# Patient Record
Sex: Male | Born: 1955 | Race: White | Hispanic: No | Smoking: Never smoker
Health system: Southern US, Community
[De-identification: ages and names within clinical notes are randomized; demographics above are authoritative.]

## PROBLEM LIST (undated history)

## (undated) DIAGNOSIS — K4020 Bilateral inguinal hernia, without obstruction or gangrene, not specified as recurrent: Secondary | ICD-10-CM

## (undated) HISTORY — PX: NECK SURGERY: SHX720

## (undated) HISTORY — PX: EYE SURGERY: SHX253

---
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
## 1997-11-06 ENCOUNTER — Ambulatory Visit (HOSPITAL_COMMUNITY): Admission: RE | Admit: 1997-11-06 | Discharge: 1997-11-06 | Payer: Self-pay | Admitting: Neurosurgery

## 1997-11-15 ENCOUNTER — Encounter: Admission: RE | Admit: 1997-11-15 | Discharge: 1998-02-13 | Payer: Self-pay | Admitting: Internal Medicine

## 1999-01-14 ENCOUNTER — Observation Stay (HOSPITAL_COMMUNITY): Admission: RE | Admit: 1999-01-14 | Discharge: 1999-01-15 | Payer: Self-pay | Admitting: Neurosurgery

## 2004-07-21 HISTORY — PX: COLONOSCOPY: SHX174

## 2004-11-12 ENCOUNTER — Ambulatory Visit: Payer: Self-pay | Admitting: Gastroenterology

## 2004-11-22 ENCOUNTER — Ambulatory Visit: Payer: Self-pay | Admitting: Gastroenterology

## 2004-11-29 ENCOUNTER — Ambulatory Visit: Payer: Self-pay

## 2012-11-23 ENCOUNTER — Encounter: Payer: Self-pay | Admitting: Gastroenterology

## 2014-09-23 ENCOUNTER — Encounter: Payer: Self-pay | Admitting: Gastroenterology

## 2014-12-11 ENCOUNTER — Encounter: Payer: Self-pay | Admitting: Gastroenterology

## 2015-01-18 ENCOUNTER — Ambulatory Visit (AMBULATORY_SURGERY_CENTER): Payer: Self-pay

## 2015-01-18 VITALS — Ht 67.0 in | Wt 164.0 lb

## 2015-01-18 DIAGNOSIS — Z1211 Encounter for screening for malignant neoplasm of colon: Secondary | ICD-10-CM

## 2015-01-18 MED ORDER — NA SULFATE-K SULFATE-MG SULF 17.5-3.13-1.6 GM/177ML PO SOLN: 1.0000 | Freq: Once | ORAL | Status: DC

## 2015-01-18 NOTE — Progress Notes (Signed)
Not on home 02 No anesthesia complications No diet drugs No egg or soy allergies

## 2015-01-23 ENCOUNTER — Encounter: Payer: Self-pay | Admitting: Gastroenterology

## 2015-02-09 ENCOUNTER — Ambulatory Visit (AMBULATORY_SURGERY_CENTER): Payer: 59 | Admitting: Gastroenterology

## 2015-02-09 ENCOUNTER — Encounter: Payer: Self-pay | Admitting: Gastroenterology

## 2015-02-09 VITALS — BP 110/72 | HR 54 | Temp 97.8°F | Resp 22 | Ht 67.0 in | Wt 164.0 lb

## 2015-02-09 DIAGNOSIS — Z1211 Encounter for screening for malignant neoplasm of colon: Secondary | ICD-10-CM

## 2015-02-09 MED ORDER — SODIUM CHLORIDE 0.9 % IV SOLN: 500.0000 mL | INTRAVENOUS | Status: DC

## 2015-02-09 NOTE — Patient Instructions (Signed)
Discharge instructions given. Handouts on diverticulosis and hemorrhoids. Resume previous medications. YOU HAD AN ENDOSCOPIC PROCEDURE TODAY AT THE  ENDOSCOPY CENTER:   Refer to the procedure report that was given to you for any specific questions about what was found during the examination.  If the procedure report does not answer your questions, please call your gastroenterologist to clarify.  If you requested that your care partner not be given the details of your procedure findings, then the procedure report has been included in a sealed envelope for you to review at your convenience later.  YOU SHOULD EXPECT: Some feelings of bloating in the abdomen. Passage of more gas than usual.  Walking can help get rid of the air that was put into your GI tract during the procedure and reduce the bloating. If you had a lower endoscopy (such as a colonoscopy or flexible sigmoidoscopy) you may notice spotting of blood in your stool or on the toilet paper. If you underwent a bowel prep for your procedure, you may not have a normal bowel movement for a few days.  Please Note:  You might notice some irritation and congestion in your nose or some drainage.  This is from the oxygen used during your procedure.  There is no need for concern and it should clear up in a day or so.  SYMPTOMS TO REPORT IMMEDIATELY:   Following lower endoscopy (colonoscopy or flexible sigmoidoscopy):  Excessive amounts of blood in the stool  Significant tenderness or worsening of abdominal pains  Swelling of the abdomen that is new, acute  Fever of 100F or higher   For urgent or emergent issues, a gastroenterologist can be reached at any hour by calling [phone].   DIET: Your first meal following the procedure should be a small meal and then it is ok to progress to your normal diet. Heavy or fried foods are harder to digest and may make you feel nauseous or bloated.  Likewise, meals heavy in dairy and vegetables can  increase bloating.  Drink plenty of fluids but you should avoid alcoholic beverages for 24 hours.  ACTIVITY:  You should plan to take it easy for the rest of today and you should NOT DRIVE or use heavy machinery until tomorrow (because of the sedation medicines used during the test).    FOLLOW UP: Our staff will call the number listed on your records the next business day following your procedure to check on you and address any questions or concerns that you may have regarding the information given to you following your procedure. If we do not reach you, we will leave a message.  However, if you are feeling well and you are not experiencing any problems, there is no need to return our call.  We will assume that you have returned to your regular daily activities without incident.  If any biopsies were taken you will be contacted by phone or by letter within the next 1-3 weeks.  Please call us at [phone] if you have not heard about the biopsies in 3 weeks.    SIGNATURES/CONFIDENTIALITY: You and/or your care partner have signed paperwork which will be entered into your electronic medical record.  These signatures attest to the fact that that the information above on your After Visit Summary has been reviewed and is understood.  Full responsibility of the confidentiality of this discharge information lies with you and/or your care-partner. 

## 2015-02-09 NOTE — Op Note (Signed)
[place]  [address]. [place], [address]   COLONOSCOPY PROCEDURE REPORT  PATIENT: Luke Hoffman, Luke Hoffman  MR#: [id number] BIRTHDATE: [birth year] , 59  yrs. old GENDER: male ENDOSCOPIST: Ladene Artist, MD, [facility] REFERRED [place]:PXTG Hall, M.D. PROCEDURE DATE:  02/09/2015 PROCEDURE:   Colonoscopy, screening First Screening Colonoscopy - Avg.  risk and is 50 yrs.  old or older - No.  Prior Negative Screening - Now for repeat screening. 10 or more years since last screening  History of Adenoma - Now for follow-up colonoscopy & has been > or = to 3 yrs.  N/A  Polyps removed today? No Recommend repeat exam, <10 yrs? No ASA CLASS:   Class II INDICATIONS:Screening for colonic neoplasia and Colorectal Neoplasm Risk Assessment for this procedure is average risk. MEDICATIONS: Monitored anesthesia care and Propofol 200 mg IV DESCRIPTION OF PROCEDURE:   After the risks benefits and alternatives of the procedure were thoroughly explained, informed consent was obtained.  The digital rectal exam revealed no abnormalities of the rectum.   The LB [id number]  endoscope was introduced through the anus and advanced to the cecum, which was identified by both the appendix and ileocecal valve. No adverse events experienced.   The quality of the prep was excellent. (Suprep was used)  The instrument was then slowly withdrawn as the colon was fully examined. Estimated blood loss is zero unless otherwise noted in this procedure report.    COLON FINDINGS: There was mild diverticulosis noted in the sigmoid colon.   The examination was otherwise normal.  Retroflexed views revealed internal Grade I hemorrhoids. The time to cecum = 1.6 Withdrawal time = 8.1   The scope was withdrawn and the procedure completed. COMPLICATIONS: There were no immediate complications.  ENDOSCOPIC IMPRESSION: 1.   Mild diverticulosis in the sigmoid colon 2.   Grade l internal  hemorrhoids  RECOMMENDATIONS: 1.  High fiber diet with liberal fluid intake. 2.  Continue current colorectal screening recommendations for "routine risk" patients with a repeat colonoscopy in 10 years.  eSigned:  Ladene Artist, MD, [facility] 02/09/2015 2:13 PM

## 2015-02-09 NOTE — Progress Notes (Signed)
Report to PACU, RN, vss, BBS= Clear.  

## 2015-02-12 ENCOUNTER — Telehealth: Payer: Self-pay

## 2015-02-12 NOTE — Telephone Encounter (Signed)
  Follow up Call-  Call back number 02/09/2015  Post procedure Call Back phone  # [phone]  Permission to leave phone message Yes     Patient questions:  Do you have a fever, pain , or abdominal swelling? No. Pain Score  0 *  Have you tolerated food without any problems? Yes.    Have you been able to return to your normal activities? Yes.    Do you have any questions about your discharge instructions: Diet   No. Medications  No. Follow up visit  No.  Do you have questions or concerns about your Care? No.  Actions: * If pain score is 4 or above: No action needed, pain <4.

## 2015-03-07 ENCOUNTER — Institutional Professional Consult (permissible substitution): Payer: Self-pay | Admitting: Internal Medicine

## 2015-06-25 ENCOUNTER — Institutional Professional Consult (permissible substitution): Payer: Self-pay | Admitting: Internal Medicine

## 2015-10-08 ENCOUNTER — Institutional Professional Consult (permissible substitution): Payer: Self-pay | Admitting: Internal Medicine

## 2015-11-01 ENCOUNTER — Ambulatory Visit (INDEPENDENT_AMBULATORY_CARE_PROVIDER_SITE_OTHER): Payer: Self-pay | Admitting: Otolaryngology

## 2017-08-03 ENCOUNTER — Ambulatory Visit (INDEPENDENT_AMBULATORY_CARE_PROVIDER_SITE_OTHER): Payer: Self-pay | Admitting: Otolaryngology

## 2017-08-03 DIAGNOSIS — H903 Sensorineural hearing loss, bilateral: Secondary | ICD-10-CM

## 2020-02-27 DIAGNOSIS — I83813 Varicose veins of bilateral lower extremities with pain: Secondary | ICD-10-CM | POA: Diagnosis not present

## 2020-02-27 DIAGNOSIS — R030 Elevated blood-pressure reading, without diagnosis of hypertension: Secondary | ICD-10-CM | POA: Diagnosis not present

## 2020-02-27 DIAGNOSIS — N390 Urinary tract infection, site not specified: Secondary | ICD-10-CM | POA: Diagnosis not present

## 2020-02-27 DIAGNOSIS — E782 Mixed hyperlipidemia: Secondary | ICD-10-CM | POA: Diagnosis not present

## 2020-02-27 DIAGNOSIS — R319 Hematuria, unspecified: Secondary | ICD-10-CM | POA: Diagnosis not present

## 2020-04-09 ENCOUNTER — Other Ambulatory Visit (HOSPITAL_BASED_OUTPATIENT_CLINIC_OR_DEPARTMENT_OTHER): Payer: Self-pay

## 2020-04-09 DIAGNOSIS — R0681 Apnea, not elsewhere classified: Secondary | ICD-10-CM

## 2020-04-10 ENCOUNTER — Other Ambulatory Visit: Payer: Self-pay

## 2020-04-10 ENCOUNTER — Ambulatory Visit: Payer: Medicare Other | Attending: Internal Medicine | Admitting: Neurology

## 2020-04-10 DIAGNOSIS — G4761 Periodic limb movement disorder: Secondary | ICD-10-CM | POA: Diagnosis not present

## 2020-04-10 DIAGNOSIS — R0681 Apnea, not elsewhere classified: Secondary | ICD-10-CM

## 2020-04-10 DIAGNOSIS — G4733 Obstructive sleep apnea (adult) (pediatric): Secondary | ICD-10-CM | POA: Insufficient documentation

## 2020-04-16 NOTE — Procedures (Signed)
   Humphrey A. Merlene Laughter, MD     [email or web address]             NOCTURNAL POLYSOMNOGRAPHY   LOCATION: [facility]  Patient Name: Luke Hoffman, Luke Hoffman Date: 04/10/2020 Gender: Male D.O.B: 08/02/1955 Age (years): 83 Referring Provider: Delphina Cahill Height (inches): 66 Interpreting Physician: Phillips Odor MD, ABSM Weight (lbs): 130 RPSGT: Rosebud Poles BMI: 21 MRN: [id number] Neck Size: 16.00 CLINICAL INFORMATION Sleep Study Type: NPSG     Indication for sleep study: N/A     Epworth Sleepiness Score: 6     SLEEP STUDY TECHNIQUE As per the AASM Manual for the Scoring of Sleep and Associated Events v2.3 (April 2016) with a hypopnea requiring 4% desaturations.  The channels recorded and monitored were frontal, central and occipital EEG, electrooculogram (EOG), submentalis EMG (chin), nasal and oral airflow, thoracic and abdominal wall motion, anterior tibialis EMG, snore microphone, electrocardiogram, and pulse oximetry.  MEDICATIONS Medications self-administered by patient taken the night of the study : N/A No current outpatient medications on file.     SLEEP ARCHITECTURE The study was initiated at 10:17:19 PM and ended at 5:45:48 AM.  Sleep onset time was 5.7 minutes and the sleep efficiency was 86.8%%. The total sleep time was 389.5 minutes.  Stage REM latency was 205.0 minutes.  The patient spent 3.9%% of the night in stage N1 sleep, 70.9%% in stage N2 sleep, 8.6%% in stage N3 and 16.7% in REM.  Alpha intrusion was absent.  Supine sleep was 75.22%.  RESPIRATORY PARAMETERS The overall apnea/hypopnea index (AHI) was 13.4 per hour. There were 27 total apneas, including 25 obstructive, 2 central and 0 mixed apneas. There were 60 hypopneas and 79 RERAs.  The AHI during Stage REM sleep was 22.2 per hour.  AHI while supine was 17.6 per hour.  The mean oxygen saturation was 93.0%. The minimum SpO2 during sleep was 84.0%.  loud snoring  was noted during this study.  CARDIAC DATA The 2 lead EKG demonstrated sinus rhythm. The mean heart rate was 54.8 beats per minute. Other EKG findings include: None.  LEG MOVEMENT DATA Moderate periodic limb movements are noted.    IMPRESSIONS 1. 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 AutoPAP 8-14 is recommended. 2. 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  Delano Metz, MD Diplomate, American Board of Sleep Medicine.  ELECTRONICALLY SIGNED ON:  04/16/2020, 6:01 PM [place] PH: [phone]   FX: [phone] [place]

## 2020-04-20 DIAGNOSIS — G4730 Sleep apnea, unspecified: Secondary | ICD-10-CM

## 2020-04-20 HISTORY — DX: Sleep apnea, unspecified: G47.30

## 2020-05-08 DIAGNOSIS — G4733 Obstructive sleep apnea (adult) (pediatric): Secondary | ICD-10-CM | POA: Diagnosis not present

## 2020-05-16 ENCOUNTER — Other Ambulatory Visit: Payer: Self-pay | Admitting: Surgery

## 2020-05-16 DIAGNOSIS — K4020 Bilateral inguinal hernia, without obstruction or gangrene, not specified as recurrent: Secondary | ICD-10-CM | POA: Diagnosis not present

## 2020-05-18 DIAGNOSIS — Z23 Encounter for immunization: Secondary | ICD-10-CM | POA: Diagnosis not present

## 2020-05-24 DIAGNOSIS — G4733 Obstructive sleep apnea (adult) (pediatric): Secondary | ICD-10-CM | POA: Diagnosis not present

## 2020-05-31 DIAGNOSIS — L578 Other skin changes due to chronic exposure to nonionizing radiation: Secondary | ICD-10-CM | POA: Diagnosis not present

## 2020-05-31 DIAGNOSIS — D225 Melanocytic nevi of trunk: Secondary | ICD-10-CM | POA: Diagnosis not present

## 2020-05-31 DIAGNOSIS — L570 Actinic keratosis: Secondary | ICD-10-CM | POA: Diagnosis not present

## 2020-05-31 DIAGNOSIS — L814 Other melanin hyperpigmentation: Secondary | ICD-10-CM | POA: Diagnosis not present

## 2020-05-31 DIAGNOSIS — G4733 Obstructive sleep apnea (adult) (pediatric): Secondary | ICD-10-CM | POA: Diagnosis not present

## 2020-05-31 DIAGNOSIS — Z86018 Personal history of other benign neoplasm: Secondary | ICD-10-CM | POA: Diagnosis not present

## 2020-05-31 DIAGNOSIS — L821 Other seborrheic keratosis: Secondary | ICD-10-CM | POA: Diagnosis not present

## 2020-06-04 ENCOUNTER — Other Ambulatory Visit: Payer: Self-pay

## 2020-06-04 ENCOUNTER — Encounter (HOSPITAL_BASED_OUTPATIENT_CLINIC_OR_DEPARTMENT_OTHER): Payer: Self-pay | Admitting: Surgery

## 2020-06-08 ENCOUNTER — Other Ambulatory Visit (HOSPITAL_COMMUNITY)
Admission: RE | Admit: 2020-06-08 | Discharge: 2020-06-08 | Disposition: A | Discharge status: Home / Self Care | Payer: Medicare Other | Source: Ambulatory Visit | Attending: Surgery | Admitting: Surgery

## 2020-06-08 DIAGNOSIS — Z01812 Encounter for preprocedural laboratory examination: Secondary | ICD-10-CM | POA: Insufficient documentation

## 2020-06-08 DIAGNOSIS — Z20822 Contact with and (suspected) exposure to covid-19: Secondary | ICD-10-CM | POA: Diagnosis not present

## 2020-06-08 DIAGNOSIS — G4733 Obstructive sleep apnea (adult) (pediatric): Secondary | ICD-10-CM | POA: Diagnosis not present

## 2020-06-08 LAB — SARS CORONAVIRUS 2 (TAT 6-24 HRS): SARS Coronavirus 2: NEGATIVE

## 2020-06-08 NOTE — Progress Notes (Signed)
      Enhanced Recovery after Surgery for Orthopedics Enhanced Recovery after Surgery is a protocol used to improve the stress on your body and your recovery after surgery.  Patient Instructions  . The night before surgery:  o No food after midnight. ONLY clear liquids after midnight  . The day of surgery (if you do NOT have diabetes):  o Drink ONE (1) Pre-Surgery Clear Ensure as directed.   o This drink was given to you during your hospital  pre-op appointment visit. o The pre-op nurse will instruct you on the time to drink the  Pre-Surgery Ensure depending on your surgery time. o Finish the drink at the designated time by the pre-op nurse.  o Nothing else to drink after completing the  Pre-Surgery Clear Ensure.  . The day of surgery (if you have diabetes): o Drink ONE (1) Gatorade 2 (G2) as directed. o This drink was given to you during your hospital  pre-op appointment visit.  o The pre-op nurse will instruct you on the time to drink the   Gatorade 2 (G2) depending on your surgery time. o Color of the Gatorade may vary. Red is not allowed. o Nothing else to drink after completing the  Gatorade 2 (G2).         If you have questions, please contact your surgeon's office.  Surgical soap given. Instructions given. Patient verbalized understanding.

## 2020-06-11 NOTE — H&P (Signed)
Luke Hoffman  Location: [facility] Patient #: [id number] DOB: [birth year] Divorced / Language: English / Race: White Male  History of Present Illness   The patient is a 64 year old male who presents with a complaint of lleft inguinal hernia.  The PCP is Dr. Merlyn Albert  The patient was referred by Dr. Merlyn Albert  The patient had noticed the hernia on the left side for at least 6 years. He's unsure whether it changed much in size. He had 2 events in July 2021 where he had abdominal pain and had to push the hernia back in. He's had no more events like that since then. He's now come for evaluation of the left sided hernia. He's had no prior abdominal surgery. He has not stomach, liver, colon disease. He's had at least 2 negative colonoscopies.  I discussed the indications and complications of hernia surgery with the patient. I discussed both the laparoscopic and open approach to hernia repair.. The potential risks of hernia surgery include, but are not limited to, bleeding, infection, open surgery, nerve injury, and recurrence of the hernia. I provided the patient literature about hernia surgery. We talked about the use of mesh in hernias and their risks. The risk of mesh include chronic infection, erosion to other structures, and chronic pain. He, in fact, has bilateral inguinal hernias. His left inguinal hernia is fairly large, his right inguinal hernia smaller. I think he is a good candidate for laparoscopic inguinal hernia repair. I discussed with him that I am retiring at the end of the year and I offered a partner to do the surgery. We will try to get him scheduled in November for the surgery.  Plan: 1. Laparoscopic bilateral inguinal hernia repair  Review of Systems as stated in this history (HPI) or in the review of systems. Otherwise all other 12 point ROS are negative  Past Medical History: 1. Colonoscopy neg in the past  Social  History: Divorced. 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  The patient's family history was non contributory.   Past Surgical History Darden Palmer, [place]; 04/19/2020 9:39 AM) Colon Polyp Removal - Colonoscopy  Oral Surgery   Diagnostic Studies History Darden Palmer, [place]; 04/19/2020 9:39 AM) Colonoscopy  5-10 years ago  Allergies (Chanel Teressa Senter, CMA; 05/16/2020 9:27 AM) No Known Drug Allergies  [04/19/2020]: Allergies Reconciled   Medication History (Chanel Teressa Senter, [place]; 05/16/2020 9:27 AM) No Current Medications Medications Reconciled  Social History Darden Palmer, [place]; 04/19/2020 9:39 AM) Alcohol use  Occasional alcohol use. Caffeine use  Carbonated beverages, Coffee, Tea. No drug use  Tobacco use  Never smoker.  Family History Darden Palmer, [place]; 04/19/2020 9:39 AM) Arthritis  Mother. Cancer  Father. Heart Disease  Mother. Hypertension  Father, Mother. Respiratory Condition  Father.  Other Problems Darden Palmer, [place]; 04/19/2020 9:39 AM) Inguinal Hernia  Sleep Apnea     Review of Systems Darden Palmer RMA; 04/19/2020 9:39 AM) General Not Present- Appetite Loss, Chills, Fatigue, Fever, Night Sweats, Weight Gain and Weight Loss. Skin Not Present- Change in Wart/Mole, Dryness, Hives, Jaundice, New Lesions, Non-Healing Wounds, Rash and Ulcer. HEENT Present- Wears glasses/contact lenses. Not Present- Earache, Hearing Loss, Hoarseness, Nose Bleed, Oral Ulcers, Ringing in the Ears, Seasonal Allergies, Sinus Pain, Sore Throat, Visual Disturbances and Yellow Eyes. Respiratory Present- Snoring. Not Present- Bloody sputum, Chronic Cough, Difficulty Breathing and Wheezing. Breast Not Present- Breast Mass, Breast Pain, Nipple Discharge and Skin  Changes. Cardiovascular Not Present- Chest Pain, Difficulty Breathing Lying Down, Leg Cramps, Palpitations, Rapid Heart Rate, Shortness of Breath and Swelling of  Extremities. Gastrointestinal Not Present- Abdominal Pain, Bloating, Bloody Stool, Change in Bowel Habits, Chronic diarrhea, Constipation, Difficulty Swallowing, Excessive gas, Gets full quickly at meals, Hemorrhoids, Indigestion, Nausea, Rectal Pain and Vomiting. Male Genitourinary Not Present- Blood in Urine, Change in Urinary Stream, Frequency, Impotence, Nocturia, Painful Urination, Urgency and Urine Leakage. Musculoskeletal Present- Joint Stiffness. Not Present- Back Pain, Joint Pain, Muscle Pain, Muscle Weakness and Swelling of Extremities. Neurological Not Present- Decreased Memory, Fainting, Headaches, Numbness, Seizures, Tingling, Tremor, Trouble walking and Weakness. Psychiatric Not Present- Anxiety, Bipolar, Change in Sleep Pattern, Depression, Fearful and Frequent crying. Endocrine Not Present- Cold Intolerance, Excessive Hunger, Hair Changes, Heat Intolerance, Hot flashes and New Diabetes. Hematology Not Present- Blood Thinners, Easy Bruising, Excessive bleeding, Gland problems, HIV and Persistent Infections.  Vitals (Chanel Nolan CMA; 05/16/2020 9:28 AM) 05/16/2020 9:27 AM Weight: 171.5 lb Height: 67in Body Surface Area: 1.89 m Body Mass Index: 26.86 kg/m  Temp.: 97.53F  Pulse: 72 (Regular)  BP: 122/82(Sitting, Left Arm, Standard)    04/19/2020 9:40 AM Weight: 170.38 lb Temp.: 98.52F  Pulse: 70 (Regular)  P.OX: 98% (Room air) BP: 120/74(Sitting, Left Arm, Standard)   Physical Exam   General: WN WM who is alert and generally healthy appearing. He is wearing a mask. HEENT: Normal. Pupils equal.  Neck: Supple. No mass. No thyroid mass. Lymph Nodes: No supraclavicular or cervical nodes.  Lungs: Clear to auscultation and symmetric breath sounds. Heart: RRR. No murmur or rub.  Abdomen: Soft. No mass. No tenderness. Normal bowel sounds. No abdominal scars.  He has a medium to large left inguinal hernia. He also has a smaller right inguinal hernia. Both  are reducible when he lays down. Rectal: Not done.  Extremities: Good strength and ROM in upper and lower extremities.  Neurologic: Grossly intact to motor and sensory function. Psychiatric: Has normal mood and affect. Behavior is normal.    Assessment & Plan  1.  INGUINAL HERNIA, BILATERAL (K40.20)  Plan:  1. Laparoscopic bilateral inguinal hernia repair   Alphonsa Overall, MD, [facility] Office phone:  [phone]

## 2020-06-12 ENCOUNTER — Other Ambulatory Visit: Payer: Self-pay

## 2020-06-12 ENCOUNTER — Ambulatory Visit (HOSPITAL_BASED_OUTPATIENT_CLINIC_OR_DEPARTMENT_OTHER): Payer: Medicare Other | Admitting: Anesthesiology

## 2020-06-12 ENCOUNTER — Encounter (HOSPITAL_BASED_OUTPATIENT_CLINIC_OR_DEPARTMENT_OTHER): Payer: Self-pay | Admitting: Surgery

## 2020-06-12 ENCOUNTER — Encounter (HOSPITAL_BASED_OUTPATIENT_CLINIC_OR_DEPARTMENT_OTHER)
Admission: RE | Disposition: A | Discharge status: Home / Self Care | Payer: Self-pay | Source: Home / Self Care | Attending: Surgery

## 2020-06-12 ENCOUNTER — Ambulatory Visit (HOSPITAL_BASED_OUTPATIENT_CLINIC_OR_DEPARTMENT_OTHER)
Admission: RE | Admit: 2020-06-12 | Discharge: 2020-06-12 | Disposition: A | Discharge status: Home / Self Care | Payer: Medicare Other | Attending: Surgery | Admitting: Surgery

## 2020-06-12 DIAGNOSIS — K4020 Bilateral inguinal hernia, without obstruction or gangrene, not specified as recurrent: Secondary | ICD-10-CM | POA: Insufficient documentation

## 2020-06-12 DIAGNOSIS — Z86010 Personal history of colonic polyps: Secondary | ICD-10-CM | POA: Diagnosis not present

## 2020-06-12 DIAGNOSIS — G4730 Sleep apnea, unspecified: Secondary | ICD-10-CM | POA: Diagnosis not present

## 2020-06-12 HISTORY — PX: INSERTION OF MESH: SHX5868

## 2020-06-12 HISTORY — DX: Bilateral inguinal hernia, without obstruction or gangrene, not specified as recurrent: K40.20

## 2020-06-12 HISTORY — PX: INGUINAL HERNIA REPAIR: SHX194

## 2020-06-12 SURGERY — REPAIR, HERNIA, INGUINAL, LAPAROSCOPIC
Anesthesia: General | Site: Abdomen | Laterality: Bilateral

## 2020-06-12 MED ORDER — BUPIVACAINE HCL 0.25 % IJ SOLN
INTRAMUSCULAR | Status: DC | PRN
  Administered 2020-06-12: 25 mL

## 2020-06-12 MED ORDER — LIDOCAINE 2% (20 MG/ML) 5 ML SYRINGE
INTRAMUSCULAR | Status: AC
  Filled 2020-06-12: qty 5

## 2020-06-12 MED ORDER — HYDROCODONE-ACETAMINOPHEN 5-325 MG PO TABS: 1.0000 | ORAL_TABLET | Freq: Four times a day (QID) | ORAL | 0 refills | Status: DC | PRN

## 2020-06-12 MED ORDER — LACTATED RINGERS IV SOLN: INTRAVENOUS | Status: DC

## 2020-06-12 MED ORDER — EPHEDRINE 5 MG/ML INJ
INTRAVENOUS | Status: AC
  Filled 2020-06-12: qty 10

## 2020-06-12 MED ORDER — CEFAZOLIN SODIUM-DEXTROSE 2-4 GM/100ML-% IV SOLN
2.0000 g | INTRAVENOUS | Status: AC
  Administered 2020-06-12: 2 g via INTRAVENOUS

## 2020-06-12 MED ORDER — ONDANSETRON HCL 4 MG/2ML IJ SOLN
INTRAMUSCULAR | Status: DC | PRN
  Administered 2020-06-12: 4 mg via INTRAVENOUS

## 2020-06-12 MED ORDER — ROCURONIUM BROMIDE 10 MG/ML (PF) SYRINGE
PREFILLED_SYRINGE | INTRAVENOUS | Status: AC
  Filled 2020-06-12: qty 10

## 2020-06-12 MED ORDER — HYDROMORPHONE HCL 1 MG/ML IJ SOLN: 0.2500 mg | INTRAMUSCULAR | Status: DC | PRN

## 2020-06-12 MED ORDER — CEFAZOLIN SODIUM-DEXTROSE 2-4 GM/100ML-% IV SOLN
INTRAVENOUS | Status: AC
  Filled 2020-06-12: qty 100

## 2020-06-12 MED ORDER — PHENYLEPHRINE 40 MCG/ML (10ML) SYRINGE FOR IV PUSH (FOR BLOOD PRESSURE SUPPORT)
PREFILLED_SYRINGE | INTRAVENOUS | Status: AC
  Filled 2020-06-12: qty 10

## 2020-06-12 MED ORDER — MEPERIDINE HCL 25 MG/ML IJ SOLN: 6.2500 mg | INTRAMUSCULAR | Status: DC | PRN

## 2020-06-12 MED ORDER — MIDAZOLAM HCL 5 MG/5ML IJ SOLN
INTRAMUSCULAR | Status: DC | PRN
  Administered 2020-06-12: 2 mg via INTRAVENOUS

## 2020-06-12 MED ORDER — ATROPINE SULFATE 0.4 MG/ML IJ SOLN
INTRAMUSCULAR | Status: DC | PRN
  Administered 2020-06-12: .4 mg via INTRAVENOUS

## 2020-06-12 MED ORDER — CHLORHEXIDINE GLUCONATE 4 % EX LIQD: 60.0000 mL | Freq: Once | CUTANEOUS | Status: DC

## 2020-06-12 MED ORDER — PROMETHAZINE HCL 25 MG/ML IJ SOLN: 6.2500 mg | INTRAMUSCULAR | Status: DC | PRN

## 2020-06-12 MED ORDER — ROCURONIUM BROMIDE 100 MG/10ML IV SOLN
INTRAVENOUS | Status: DC | PRN
  Administered 2020-06-12: 80 mg via INTRAVENOUS

## 2020-06-12 MED ORDER — PROPOFOL 10 MG/ML IV BOLUS
INTRAVENOUS | Status: DC | PRN
  Administered 2020-06-12: 200 mg via INTRAVENOUS

## 2020-06-12 MED ORDER — LIDOCAINE HCL (CARDIAC) PF 100 MG/5ML IV SOSY
PREFILLED_SYRINGE | INTRAVENOUS | Status: DC | PRN
  Administered 2020-06-12: 80 mg via INTRAVENOUS

## 2020-06-12 MED ORDER — KETOROLAC TROMETHAMINE 30 MG/ML IJ SOLN
INTRAMUSCULAR | Status: AC
  Filled 2020-06-12: qty 1

## 2020-06-12 MED ORDER — SUGAMMADEX SODIUM 200 MG/2ML IV SOLN
INTRAVENOUS | Status: DC | PRN
  Administered 2020-06-12: 200 mg via INTRAVENOUS

## 2020-06-12 MED ORDER — ACETAMINOPHEN 500 MG PO TABS
ORAL_TABLET | ORAL | Status: AC
  Filled 2020-06-12: qty 2

## 2020-06-12 MED ORDER — EPHEDRINE SULFATE 50 MG/ML IJ SOLN
INTRAMUSCULAR | Status: DC | PRN
  Administered 2020-06-12: 25 mg via INTRAVENOUS

## 2020-06-12 MED ORDER — FENTANYL CITRATE (PF) 100 MCG/2ML IJ SOLN
INTRAMUSCULAR | Status: AC
  Filled 2020-06-12: qty 2

## 2020-06-12 MED ORDER — KETOROLAC TROMETHAMINE 30 MG/ML IJ SOLN
INTRAMUSCULAR | Status: DC | PRN
  Administered 2020-06-12: 30 mg via INTRAVENOUS

## 2020-06-12 MED ORDER — MIDAZOLAM HCL 2 MG/2ML IJ SOLN
INTRAMUSCULAR | Status: AC
  Filled 2020-06-12: qty 2

## 2020-06-12 MED ORDER — AMISULPRIDE (ANTIEMETIC) 5 MG/2ML IV SOLN: 10.0000 mg | Freq: Once | INTRAVENOUS | Status: DC | PRN

## 2020-06-12 MED ORDER — OXYCODONE HCL 5 MG/5ML PO SOLN: 5.0000 mg | Freq: Once | ORAL | Status: DC | PRN

## 2020-06-12 MED ORDER — ACETAMINOPHEN 500 MG PO TABS
1000.0000 mg | ORAL_TABLET | ORAL | Status: AC
  Administered 2020-06-12: 1000 mg via ORAL

## 2020-06-12 MED ORDER — FENTANYL CITRATE (PF) 100 MCG/2ML IJ SOLN
INTRAMUSCULAR | Status: DC | PRN
  Administered 2020-06-12 (×3): 50 ug via INTRAVENOUS

## 2020-06-12 MED ORDER — OXYCODONE HCL 5 MG PO TABS: 5.0000 mg | ORAL_TABLET | Freq: Once | ORAL | Status: DC | PRN

## 2020-06-12 MED ORDER — PROPOFOL 10 MG/ML IV BOLUS
INTRAVENOUS | Status: AC
  Filled 2020-06-12: qty 20

## 2020-06-12 MED ORDER — DEXAMETHASONE SODIUM PHOSPHATE 4 MG/ML IJ SOLN
INTRAMUSCULAR | Status: DC | PRN
  Administered 2020-06-12: 5 mg via INTRAVENOUS

## 2020-06-12 MED ORDER — ONDANSETRON HCL 4 MG/2ML IJ SOLN
INTRAMUSCULAR | Status: AC
  Filled 2020-06-12: qty 2

## 2020-06-12 SURGICAL SUPPLY — 38 items
ADH SKN CLS APL DERMABOND .7 (GAUZE/BANDAGES/DRESSINGS) ×1
BLADE CLIPPER SURG (BLADE) ×2 IMPLANT
COVER WAND RF STERILE (DRAPES) ×3 IMPLANT
DERMABOND ADVANCED (GAUZE/BANDAGES/DRESSINGS) ×2
DERMABOND ADVANCED .7 DNX12 (GAUZE/BANDAGES/DRESSINGS) ×1 IMPLANT
DISSECT BALLN SPACEMKR + OVL (BALLOONS) ×3
DISSECTOR BALLN SPACEMKR + OVL (BALLOONS) ×1 IMPLANT
ELECT REM PT RETURN 9FT ADLT (ELECTROSURGICAL) ×3
ELECTRODE REM PT RTRN 9FT ADLT (ELECTROSURGICAL) ×1 IMPLANT
GLOVE BIO SURGEON STRL SZ 6.5 (GLOVE) ×1 IMPLANT
GLOVE BIO SURGEON STRL SZ7.5 (GLOVE) ×2 IMPLANT
GLOVE BIO SURGEONS STRL SZ 6.5 (GLOVE) ×1
GLOVE BIOGEL PI IND STRL 7.0 (GLOVE) IMPLANT
GLOVE BIOGEL PI IND STRL 7.5 (GLOVE) IMPLANT
GLOVE BIOGEL PI INDICATOR 7.0 (GLOVE) ×2
GLOVE BIOGEL PI INDICATOR 7.5 (GLOVE) ×2
GLOVE SURG SYN 7.5  E (GLOVE) ×3
GLOVE SURG SYN 7.5 E (GLOVE) ×1 IMPLANT
GLOVE SURG SYN 7.5 PF PI (GLOVE) ×1 IMPLANT
GOWN STRL REUS W/ TWL LRG LVL3 (GOWN DISPOSABLE) ×2 IMPLANT
GOWN STRL REUS W/ TWL XL LVL3 (GOWN DISPOSABLE) ×1 IMPLANT
GOWN STRL REUS W/TWL LRG LVL3 (GOWN DISPOSABLE) ×6
GOWN STRL REUS W/TWL XL LVL3 (GOWN DISPOSABLE) ×3
MESH 3DMAX LIGHT 4.1X6.2 RT LR (Mesh General) ×2 IMPLANT
MESH 3DMAX LIGHT 4.8X6.7 LT XL (Mesh General) ×2 IMPLANT
NS IRRIG 1000ML POUR BTL (IV SOLUTION) ×3 IMPLANT
PACK BASIN DAY SURGERY FS (CUSTOM PROCEDURE TRAY) ×2 IMPLANT
SCISSORS LAP 5X35 DISP (ENDOMECHANICALS) ×2 IMPLANT
SET TUBE SMOKE EVAC HIGH FLOW (TUBING) ×3 IMPLANT
SLEEVE ENDOPATH XCEL 5M (ENDOMECHANICALS) ×3 IMPLANT
SUT MNCRL AB 4-0 PS2 18 (SUTURE) ×3 IMPLANT
SUT VICRYL 3-0 CR8 SH (SUTURE) ×2 IMPLANT
TACKER 5MM HERNIA 3.5CML NAB (ENDOMECHANICALS) ×3 IMPLANT
TOWEL GREEN STERILE FF (TOWEL DISPOSABLE) ×3 IMPLANT
TRAY FOL W/BAG SLVR 16FR STRL (SET/KITS/TRAYS/PACK) IMPLANT
TRAY FOLEY W/BAG SLVR 16FR LF (SET/KITS/TRAYS/PACK) ×3
TRAY LAPAROSCOPIC (CUSTOM PROCEDURE TRAY) ×3 IMPLANT
TROCAR XCEL NON-BLD 5MMX100MML (ENDOMECHANICALS) ×3 IMPLANT

## 2020-06-12 NOTE — Interval H&P Note (Signed)
History and Physical Interval Note:  06/12/2020 9:19 AM  Luke Hoffman  has presented today for surgery, with the diagnosis of bilateral inguinal hernia.  The various methods of treatment have been discussed with the patient and family.   His son, Sharen Heck, is here with him.  After consideration of risks, benefits and other options for treatment, the patient has consented to  Procedure(s): LAPAROSCOPIC BILATERAL INGUINAL HERNIA REPAIR WITH MESH (Bilateral) as a surgical intervention.  The patient's history has been reviewed, patient examined, no change in status, stable for surgery.  I have reviewed the patient's chart and labs.  Questions were answered to the patient's satisfaction.     Shann Medal

## 2020-06-12 NOTE — Anesthesia Postprocedure Evaluation (Signed)
Anesthesia Post Note  Patient: JOVAN COLLIGAN  Procedure(s) Performed: LAPAROSCOPIC BILATERAL INGUINAL HERNIA REPAIR WITH MESH (Bilateral Abdomen) INSERTION OF MESH (Bilateral Abdomen)     Patient location during evaluation: PACU Anesthesia Type: General Level of consciousness: awake and alert Pain management: pain level controlled Vital Signs Assessment: post-procedure vital signs reviewed and stable Respiratory status: spontaneous breathing, nonlabored ventilation and respiratory function stable Cardiovascular status: blood pressure returned to baseline and stable Postop Assessment: no apparent nausea or vomiting Anesthetic complications: no   No complications documented.  Last Vitals:  Vitals:   06/12/20 1145 06/12/20 1200  BP: 140/81 132/88  Pulse: 79 63  Resp: 19 16  Temp:  36.8 C  SpO2: 98% 97%    Last Pain:  Vitals:   06/12/20 1200  TempSrc:   PainSc: 0-No pain                 Lynda Rainwater

## 2020-06-12 NOTE — Anesthesia Procedure Notes (Signed)
Procedure Name: Intubation Date/Time: 06/12/2020 9:39 AM Performed by: Bufford Spikes, CRNA Pre-anesthesia Checklist: Patient identified, Emergency Drugs available, Suction available and Patient being monitored Patient Re-evaluated:Patient Re-evaluated prior to induction Oxygen Delivery Method: Circle system utilized Preoxygenation: Pre-oxygenation with 100% oxygen Induction Type: IV induction Ventilation: Mask ventilation without difficulty Laryngoscope Size: Miller and 2 Grade View: Grade II Tube type: Oral Tube size: 7.0 mm Number of attempts: 2 Airway Equipment and Method: Stylet and Oral airway Placement Confirmation: ETT inserted through vocal cords under direct vision,  positive ETCO2 and breath sounds checked- equal and bilateral Secured at: 21 cm Tube secured with: Tape Dental Injury: Teeth and Oropharynx as per pre-operative assessment

## 2020-06-12 NOTE — Anesthesia Preprocedure Evaluation (Signed)
Anesthesia Evaluation  Patient identified by MRN, date of birth, ID band Patient awake    Reviewed: Allergy & Precautions, NPO status , Patient's Chart, lab work & pertinent test results  Airway Mallampati: II  TM Distance: >3 FB Neck ROM: Full    Dental no notable dental hx.    Pulmonary sleep apnea ,    Pulmonary exam normal breath sounds clear to auscultation       Cardiovascular negative cardio ROS Normal cardiovascular exam Rhythm:Regular Rate:Normal     Neuro/Psych negative neurological ROS  negative psych ROS   GI/Hepatic negative GI ROS, Neg liver ROS,   Endo/Other  negative endocrine ROS  Renal/GU negative Renal ROS  negative genitourinary   Musculoskeletal negative musculoskeletal ROS (+)   Abdominal   Peds negative pediatric ROS (+)  Hematology negative hematology ROS (+)   Anesthesia Other Findings   Reproductive/Obstetrics negative OB ROS                             Anesthesia Physical Anesthesia Plan  ASA: II  Anesthesia Plan: General   Post-op Pain Management:    Induction: Intravenous  PONV Risk Score and Plan: 2 and Ondansetron, Midazolam and Treatment may vary due to age or medical condition  Airway Management Planned: Oral ETT  Additional Equipment:   Intra-op Plan:   Post-operative Plan: Extubation in OR  Informed Consent: I have reviewed the patients History and Physical, chart, labs and discussed the procedure including the risks, benefits and alternatives for the proposed anesthesia with the patient or authorized representative who has indicated his/her understanding and acceptance.     Dental advisory given  Plan Discussed with: CRNA  Anesthesia Plan Comments:         Anesthesia Quick Evaluation

## 2020-06-12 NOTE — Op Note (Signed)
06/12/2020  11:07 AM  PATIENT:  Luke Hoffman, 64 y.o., male  MRN: [id number]  DOB: 1955/08/21  PREOP DIAGNOSIS:  bilateral inguinal hernia  POSTOP DIAGNOSIS:   Bilateral direct inguinal hernias, left larger than right  PROCEDURE:   Procedure(s):  LAPAROSCOPIC BILATERAL INGUINAL HERNIA REPAIR WITH MESH,  INSERTION OF MESH  SURGEON:   Alphonsa Overall, M.D.  ANESTHESIA:   general  Anesthesiologist: Lynda Rainwater, MD CRNA: Bufford Spikes, CRNA; Glory Buff, CRNA  General  EBL:  minimal  ml  LOCAL MEDICATIONS USED:   25 cc of 1/4% marcaine  SPECIMEN:   None  COUNTS CORRECT:  YES  INDICATIONS FOR PROCEDURE:  [name] is a 64 y.o. (DOB: September 02, 1955) white male whose primary care physician is Celene Squibb, MD and comes for laparoscopic repair of bilateral inguinal hernias.   The indications and risks of the hernia surgery were explained to the patient.  The risks include, but are not limited to, infection, bleeding, recurrence of the hernia, and nerve injury.  Operative Note:  The patient was taken to room number 5 at [facility].  He underwent a general anesthesia.     A time out was held and the surgical checklist run.   His lower abdomen was shaved and then prepped with chloroprep.  he had a foley catheter placed.   I made an infraumbilical incision and cut down to the rectus fascia.  I went the left side, opened the anterior rectus fascia, retracted the rectus muscle anteriorly and placed the PBD balloon in the preperitoneal space.  The balloon was insufflated under direct visualization.  The balloon was in the correct position and the dissection created a preperitoneal space.   The patient had a bilateral direct inguinal hernias, the left side medium/large sized and the right side medium sized.  I dissected the preperitoneal fat posteriorly.  The cord structures were encircled.  There was no evidence of an indirect inguinal hernia.  The peritoneum  was reduced to below the level of the anterior iliac spine.   I then placed the mesh in the preperitoneal space.  I used the Extra large Bard 3D Max Light Mesh (12.2 cm x 17.0 cm) on the left and the large Bard 3D Max Light Mesh (10.3 cm x 15.7 cm) on the right.  The mesh lay flat.  I used the Protac to secure the mesh.  I used 10 clips on the left and 10 clips on the right.  The mesh was tacked medially to the pubic bone, inferiorly to Cooper's ligament, superiorly to transversalis fascia, and laterally.  There was an overlap of 2 cm in the midline of the mesh. I avoided tacks in the area lateral to the iliac vessels and laterally below the ileo-inguinal ligament.   Photos were taken and placed in the chart.  The preperitoneal space was deflated, there were no gaps around the mesh, and the trocars removed under direct visualization.   I infiltrated the wounds with 25 cc of 1/4% Marcaine.  The anterior rectus fascia was closed with 0 Vicryl.  The skin was closed with 4-0 monocryl and painted with LiquidBand. The sponge and needle count were correct at the end of the case.  The patient was transported to the recovery room in good condition.  His foley was removed.   The patient will go home today.  Discharge instructions reviewed.  Alphonsa Overall, MD, [facility] Pager: [phone] Office phone:  [phone]

## 2020-06-12 NOTE — Transfer of Care (Signed)
Immediate Anesthesia Transfer of Care Note  Patient: Luke Hoffman  Procedure(s) Performed: LAPAROSCOPIC BILATERAL INGUINAL HERNIA REPAIR WITH MESH (Bilateral Abdomen) INSERTION OF MESH (Bilateral Abdomen)  Patient Location: PACU  Anesthesia Type:General  Level of Consciousness: drowsy and patient cooperative  Airway & Oxygen Therapy: Patient Spontanous Breathing and Patient connected to face mask oxygen  Post-op Assessment: Report given to RN and Post -op Vital signs reviewed and stable  Post vital signs: Reviewed and stable  Last Vitals:  Vitals Value Taken Time  BP 151/91 06/12/20 1114  Temp    Pulse 91 06/12/20 1115  Resp 28 06/12/20 1115  SpO2 100 % 06/12/20 1115  Vitals shown include unvalidated device data.  Last Pain:  Vitals:   06/12/20 0807  TempSrc: Oral  PainSc: 0-No pain      Patients Stated Pain Goal: 3 (05/06/50 0258)  Complications: No complications documented.

## 2020-06-12 NOTE — Discharge Instructions (Signed)
Next dose of tylenol can be given after 2:15PM. Next dose of NSAID (Ibuprofen, Motrin, Aleve) can be given after 5:00PM.  Post Anesthesia Home Care Instructions  Activity: Get plenty of rest for the remainder of the day. A responsible individual must stay with you for 24 hours following the procedure.  For the next 24 hours, DO NOT: -Drive a car -Paediatric nurse -Drink alcoholic beverages -Take any medication unless instructed by your physician -Make any legal decisions or sign important papers.  Meals: Start with liquid foods such as gelatin or soup. Progress to regular foods as tolerated. Avoid greasy, spicy, heavy foods. If nausea and/or vomiting occur, drink only clear liquids until the nausea and/or vomiting subsides. Call your physician if vomiting continues.  Special Instructions/Symptoms: Your throat may feel dry or sore from the anesthesia or the breathing tube placed in your throat during surgery. If this causes discomfort, gargle with warm salt water. The discomfort should disappear within 24 hours.  If you had a scopolamine patch placed behind your ear for the management of post- operative nausea and/or vomiting:  1. The medication in the patch is effective for 72 hours, after which it should be removed.  Wrap patch in a tissue and discard in the trash. Wash hands thoroughly with soap and water. 2. You may remove the patch earlier than 72 hours if you experience unpleasant side effects which may include dry mouth, dizziness or visual disturbances. 3. Avoid touching the patch. Wash your hands with soap and water after contact with the patch.   [facility] - DISCHARGE INSTRUCTIONS TO PATIENT  Activity:  Driving - May drive in 2 to 4 days, if doing well and off pain meds   Lifting - No lifting more than 15 pounds for 4 weeks.                       Practice your Covid-19 protection:  Wear a mask, social distance, and wash your hands frequently  Wound Care:    Leave the incision dry for 2 days, then you may shower  Diet:  As tolerated  Follow up appointment:  Call Dr. Pollie Friar office [facility]) at [phone] for an appointment in 2 weeks..  Medications and dosages:  Resume your home medications.  You have a prescription for:  Vicodin             You may also take Tylenol, ibuprofen, or Aleve for pain  Call Dr. Lucia Gaskins or his office  [phone]) if you have:  Temperature greater than 100.4,  Persistent nausea and vomiting,  Severe uncontrolled pain,  Redness, tenderness, or signs of infection (pain, swelling, redness, odor or green/yellow discharge around the site),  Difficulty breathing, headache or visual disturbances,  Any other questions or concerns you may have after discharge.  In an emergency, call 911 or go to an Emergency Department at a nearby hospital.    Post Anesthesia Home Care Instructions  Activity: Get plenty of rest for the remainder of the day. A responsible individual must stay with you for 24 hours following the procedure.  For the next 24 hours, DO NOT: -Drive a car -Paediatric nurse -Drink alcoholic beverages -Take any medication unless instructed by your physician -Make any legal decisions or sign important papers.  Meals: Start with liquid foods such as gelatin or soup. Progress to regular foods as tolerated. Avoid greasy, spicy, heavy foods. If nausea and/or vomiting occur, drink only clear liquids until the nausea  and/or vomiting subsides. Call your physician if vomiting continues.  Special Instructions/Symptoms: Your throat may feel dry or sore from the anesthesia or the breathing tube placed in your throat during surgery. If this causes discomfort, gargle with warm salt water. The discomfort should disappear within 24 hours.  If you had a scopolamine patch placed behind your ear for the management of post- operative nausea and/or vomiting:  1. The medication in the patch is  effective for 72 hours, after which it should be removed.  Wrap patch in a tissue and discard in the trash. Wash hands thoroughly with soap and water. 2. You may remove the patch earlier than 72 hours if you experience unpleasant side effects which may include dry mouth, dizziness or visual disturbances. 3. Avoid touching the patch. Wash your hands with soap and water after contact with the patch.

## 2020-06-15 ENCOUNTER — Encounter (HOSPITAL_BASED_OUTPATIENT_CLINIC_OR_DEPARTMENT_OTHER): Payer: Self-pay | Admitting: Surgery

## 2020-07-08 DIAGNOSIS — G4733 Obstructive sleep apnea (adult) (pediatric): Secondary | ICD-10-CM | POA: Diagnosis not present

## 2020-07-23 DIAGNOSIS — R030 Elevated blood-pressure reading, without diagnosis of hypertension: Secondary | ICD-10-CM | POA: Diagnosis not present

## 2020-07-23 DIAGNOSIS — G4733 Obstructive sleep apnea (adult) (pediatric): Secondary | ICD-10-CM | POA: Diagnosis not present

## 2020-07-23 DIAGNOSIS — K4020 Bilateral inguinal hernia, without obstruction or gangrene, not specified as recurrent: Secondary | ICD-10-CM | POA: Diagnosis not present

## 2020-08-08 DIAGNOSIS — G4733 Obstructive sleep apnea (adult) (pediatric): Secondary | ICD-10-CM | POA: Diagnosis not present

## 2020-08-10 DIAGNOSIS — H52203 Unspecified astigmatism, bilateral: Secondary | ICD-10-CM | POA: Diagnosis not present

## 2020-08-10 DIAGNOSIS — H5213 Myopia, bilateral: Secondary | ICD-10-CM | POA: Diagnosis not present

## 2020-08-10 DIAGNOSIS — H524 Presbyopia: Secondary | ICD-10-CM | POA: Diagnosis not present

## 2020-08-10 DIAGNOSIS — H25813 Combined forms of age-related cataract, bilateral: Secondary | ICD-10-CM | POA: Diagnosis not present

## 2020-09-03 DIAGNOSIS — R319 Hematuria, unspecified: Secondary | ICD-10-CM | POA: Diagnosis not present

## 2020-09-03 DIAGNOSIS — N029 Recurrent and persistent hematuria with unspecified morphologic changes: Secondary | ICD-10-CM | POA: Diagnosis not present

## 2020-09-03 DIAGNOSIS — E875 Hyperkalemia: Secondary | ICD-10-CM | POA: Diagnosis not present

## 2020-09-03 DIAGNOSIS — H9190 Unspecified hearing loss, unspecified ear: Secondary | ICD-10-CM | POA: Diagnosis not present

## 2020-09-03 DIAGNOSIS — I83813 Varicose veins of bilateral lower extremities with pain: Secondary | ICD-10-CM | POA: Diagnosis not present

## 2020-09-03 DIAGNOSIS — L309 Dermatitis, unspecified: Secondary | ICD-10-CM | POA: Diagnosis not present

## 2020-09-06 DIAGNOSIS — M19041 Primary osteoarthritis, right hand: Secondary | ICD-10-CM | POA: Diagnosis not present

## 2020-09-06 DIAGNOSIS — M19042 Primary osteoarthritis, left hand: Secondary | ICD-10-CM | POA: Diagnosis not present

## 2020-09-06 DIAGNOSIS — Z0001 Encounter for general adult medical examination with abnormal findings: Secondary | ICD-10-CM | POA: Diagnosis not present

## 2020-09-06 DIAGNOSIS — E875 Hyperkalemia: Secondary | ICD-10-CM | POA: Diagnosis not present

## 2020-09-06 DIAGNOSIS — L570 Actinic keratosis: Secondary | ICD-10-CM | POA: Diagnosis not present

## 2020-09-06 DIAGNOSIS — M79676 Pain in unspecified toe(s): Secondary | ICD-10-CM | POA: Diagnosis not present

## 2020-09-06 DIAGNOSIS — K4030 Unilateral inguinal hernia, with obstruction, without gangrene, not specified as recurrent: Secondary | ICD-10-CM | POA: Diagnosis not present

## 2020-09-08 DIAGNOSIS — G4733 Obstructive sleep apnea (adult) (pediatric): Secondary | ICD-10-CM | POA: Diagnosis not present

## 2020-10-01 DIAGNOSIS — L309 Dermatitis, unspecified: Secondary | ICD-10-CM | POA: Diagnosis not present

## 2020-10-01 DIAGNOSIS — L570 Actinic keratosis: Secondary | ICD-10-CM | POA: Diagnosis not present

## 2020-10-06 DIAGNOSIS — G4733 Obstructive sleep apnea (adult) (pediatric): Secondary | ICD-10-CM | POA: Diagnosis not present

## 2020-11-06 DIAGNOSIS — G4733 Obstructive sleep apnea (adult) (pediatric): Secondary | ICD-10-CM | POA: Diagnosis not present

## 2020-12-06 DIAGNOSIS — G4733 Obstructive sleep apnea (adult) (pediatric): Secondary | ICD-10-CM | POA: Diagnosis not present

## 2020-12-11 DIAGNOSIS — Q828 Other specified congenital malformations of skin: Secondary | ICD-10-CM | POA: Diagnosis not present

## 2020-12-11 DIAGNOSIS — L309 Dermatitis, unspecified: Secondary | ICD-10-CM | POA: Diagnosis not present

## 2021-01-06 DIAGNOSIS — G4733 Obstructive sleep apnea (adult) (pediatric): Secondary | ICD-10-CM | POA: Diagnosis not present

## 2021-02-05 DIAGNOSIS — G4733 Obstructive sleep apnea (adult) (pediatric): Secondary | ICD-10-CM | POA: Diagnosis not present

## 2021-03-07 DIAGNOSIS — R978 Other abnormal tumor markers: Secondary | ICD-10-CM | POA: Diagnosis not present

## 2021-03-07 DIAGNOSIS — I83893 Varicose veins of bilateral lower extremities with other complications: Secondary | ICD-10-CM | POA: Diagnosis not present

## 2021-03-07 DIAGNOSIS — E782 Mixed hyperlipidemia: Secondary | ICD-10-CM | POA: Diagnosis not present

## 2021-03-07 DIAGNOSIS — Z0001 Encounter for general adult medical examination with abnormal findings: Secondary | ICD-10-CM | POA: Diagnosis not present

## 2021-03-07 DIAGNOSIS — E875 Hyperkalemia: Secondary | ICD-10-CM | POA: Diagnosis not present

## 2021-03-07 DIAGNOSIS — K4090 Unilateral inguinal hernia, without obstruction or gangrene, not specified as recurrent: Secondary | ICD-10-CM | POA: Diagnosis not present

## 2021-03-07 DIAGNOSIS — R030 Elevated blood-pressure reading, without diagnosis of hypertension: Secondary | ICD-10-CM | POA: Diagnosis not present

## 2021-03-08 DIAGNOSIS — G4733 Obstructive sleep apnea (adult) (pediatric): Secondary | ICD-10-CM | POA: Diagnosis not present

## 2021-03-19 DIAGNOSIS — G4733 Obstructive sleep apnea (adult) (pediatric): Secondary | ICD-10-CM | POA: Diagnosis not present

## 2021-04-08 DIAGNOSIS — G4733 Obstructive sleep apnea (adult) (pediatric): Secondary | ICD-10-CM | POA: Diagnosis not present

## 2021-04-18 DIAGNOSIS — I10 Essential (primary) hypertension: Secondary | ICD-10-CM | POA: Diagnosis not present

## 2021-05-13 DIAGNOSIS — Z23 Encounter for immunization: Secondary | ICD-10-CM | POA: Diagnosis not present

## 2021-06-06 DIAGNOSIS — L578 Other skin changes due to chronic exposure to nonionizing radiation: Secondary | ICD-10-CM | POA: Diagnosis not present

## 2021-06-06 DIAGNOSIS — Z86018 Personal history of other benign neoplasm: Secondary | ICD-10-CM | POA: Diagnosis not present

## 2021-06-06 DIAGNOSIS — E875 Hyperkalemia: Secondary | ICD-10-CM | POA: Diagnosis not present

## 2021-06-06 DIAGNOSIS — L821 Other seborrheic keratosis: Secondary | ICD-10-CM | POA: Diagnosis not present

## 2021-06-06 DIAGNOSIS — L814 Other melanin hyperpigmentation: Secondary | ICD-10-CM | POA: Diagnosis not present

## 2021-06-06 DIAGNOSIS — Z23 Encounter for immunization: Secondary | ICD-10-CM | POA: Diagnosis not present

## 2021-06-06 DIAGNOSIS — R030 Elevated blood-pressure reading, without diagnosis of hypertension: Secondary | ICD-10-CM | POA: Diagnosis not present

## 2021-06-06 DIAGNOSIS — D225 Melanocytic nevi of trunk: Secondary | ICD-10-CM | POA: Diagnosis not present

## 2021-06-06 DIAGNOSIS — L570 Actinic keratosis: Secondary | ICD-10-CM | POA: Diagnosis not present

## 2021-06-06 DIAGNOSIS — E782 Mixed hyperlipidemia: Secondary | ICD-10-CM | POA: Insufficient documentation

## 2021-06-06 DIAGNOSIS — R978 Other abnormal tumor markers: Secondary | ICD-10-CM | POA: Diagnosis not present

## 2021-06-06 DIAGNOSIS — L565 Disseminated superficial actinic porokeratosis (DSAP): Secondary | ICD-10-CM | POA: Diagnosis not present

## 2021-06-12 DIAGNOSIS — I10 Essential (primary) hypertension: Secondary | ICD-10-CM | POA: Insufficient documentation

## 2021-08-12 DIAGNOSIS — H25813 Combined forms of age-related cataract, bilateral: Secondary | ICD-10-CM | POA: Diagnosis not present

## 2021-09-13 DIAGNOSIS — E782 Mixed hyperlipidemia: Secondary | ICD-10-CM | POA: Diagnosis not present

## 2021-09-13 DIAGNOSIS — R978 Other abnormal tumor markers: Secondary | ICD-10-CM | POA: Diagnosis not present

## 2021-09-13 DIAGNOSIS — R030 Elevated blood-pressure reading, without diagnosis of hypertension: Secondary | ICD-10-CM | POA: Diagnosis not present

## 2021-09-18 DIAGNOSIS — Z0001 Encounter for general adult medical examination with abnormal findings: Secondary | ICD-10-CM | POA: Diagnosis not present

## 2021-09-18 DIAGNOSIS — M79604 Pain in right leg: Secondary | ICD-10-CM | POA: Diagnosis not present

## 2021-11-25 DIAGNOSIS — H903 Sensorineural hearing loss, bilateral: Secondary | ICD-10-CM | POA: Diagnosis not present

## 2021-12-07 DIAGNOSIS — I10 Essential (primary) hypertension: Secondary | ICD-10-CM | POA: Diagnosis not present

## 2021-12-07 DIAGNOSIS — M7989 Other specified soft tissue disorders: Secondary | ICD-10-CM | POA: Diagnosis not present

## 2021-12-09 DIAGNOSIS — R601 Generalized edema: Secondary | ICD-10-CM | POA: Diagnosis not present

## 2021-12-09 DIAGNOSIS — I10 Essential (primary) hypertension: Secondary | ICD-10-CM | POA: Diagnosis not present

## 2021-12-09 DIAGNOSIS — M7989 Other specified soft tissue disorders: Secondary | ICD-10-CM | POA: Diagnosis not present

## 2021-12-10 ENCOUNTER — Other Ambulatory Visit (HOSPITAL_COMMUNITY): Payer: Self-pay | Admitting: Family Medicine

## 2021-12-10 ENCOUNTER — Other Ambulatory Visit: Payer: Self-pay | Admitting: Family Medicine

## 2021-12-10 DIAGNOSIS — N179 Acute kidney failure, unspecified: Secondary | ICD-10-CM

## 2021-12-11 ENCOUNTER — Ambulatory Visit (HOSPITAL_COMMUNITY)
Admission: RE | Admit: 2021-12-11 | Discharge: 2021-12-11 | Disposition: A | Discharge status: Home / Self Care | Payer: Medicare Other | Source: Ambulatory Visit | Attending: Family Medicine | Admitting: Family Medicine

## 2021-12-11 DIAGNOSIS — K7689 Other specified diseases of liver: Secondary | ICD-10-CM | POA: Diagnosis not present

## 2021-12-11 DIAGNOSIS — R1900 Intra-abdominal and pelvic swelling, mass and lump, unspecified site: Secondary | ICD-10-CM | POA: Insufficient documentation

## 2021-12-11 DIAGNOSIS — K753 Granulomatous hepatitis, not elsewhere classified: Secondary | ICD-10-CM | POA: Diagnosis not present

## 2021-12-11 DIAGNOSIS — K5730 Diverticulosis of large intestine without perforation or abscess without bleeding: Secondary | ICD-10-CM | POA: Diagnosis not present

## 2021-12-11 DIAGNOSIS — N179 Acute kidney failure, unspecified: Secondary | ICD-10-CM | POA: Diagnosis not present

## 2021-12-12 DIAGNOSIS — E875 Hyperkalemia: Secondary | ICD-10-CM | POA: Diagnosis not present

## 2021-12-12 DIAGNOSIS — E8809 Other disorders of plasma-protein metabolism, not elsewhere classified: Secondary | ICD-10-CM | POA: Diagnosis not present

## 2021-12-12 DIAGNOSIS — R601 Generalized edema: Secondary | ICD-10-CM | POA: Diagnosis not present

## 2021-12-12 DIAGNOSIS — N179 Acute kidney failure, unspecified: Secondary | ICD-10-CM | POA: Diagnosis not present

## 2021-12-12 DIAGNOSIS — I129 Hypertensive chronic kidney disease with stage 1 through stage 4 chronic kidney disease, or unspecified chronic kidney disease: Secondary | ICD-10-CM | POA: Diagnosis not present

## 2021-12-12 DIAGNOSIS — N170 Acute kidney failure with tubular necrosis: Secondary | ICD-10-CM | POA: Diagnosis not present

## 2021-12-12 DIAGNOSIS — E782 Mixed hyperlipidemia: Secondary | ICD-10-CM | POA: Diagnosis not present

## 2021-12-17 DIAGNOSIS — R809 Proteinuria, unspecified: Secondary | ICD-10-CM | POA: Diagnosis not present

## 2021-12-17 DIAGNOSIS — I129 Hypertensive chronic kidney disease with stage 1 through stage 4 chronic kidney disease, or unspecified chronic kidney disease: Secondary | ICD-10-CM | POA: Diagnosis not present

## 2021-12-17 DIAGNOSIS — E559 Vitamin D deficiency, unspecified: Secondary | ICD-10-CM | POA: Diagnosis not present

## 2021-12-17 DIAGNOSIS — N170 Acute kidney failure with tubular necrosis: Secondary | ICD-10-CM | POA: Diagnosis not present

## 2021-12-17 DIAGNOSIS — Z1159 Encounter for screening for other viral diseases: Secondary | ICD-10-CM | POA: Diagnosis not present

## 2021-12-17 DIAGNOSIS — E8809 Other disorders of plasma-protein metabolism, not elsewhere classified: Secondary | ICD-10-CM | POA: Diagnosis not present

## 2021-12-17 DIAGNOSIS — R601 Generalized edema: Secondary | ICD-10-CM | POA: Diagnosis not present

## 2021-12-17 DIAGNOSIS — Z79899 Other long term (current) drug therapy: Secondary | ICD-10-CM | POA: Diagnosis not present

## 2021-12-17 DIAGNOSIS — E875 Hyperkalemia: Secondary | ICD-10-CM | POA: Diagnosis not present

## 2021-12-18 ENCOUNTER — Other Ambulatory Visit: Payer: Self-pay | Admitting: Nephrology

## 2021-12-18 ENCOUNTER — Other Ambulatory Visit (HOSPITAL_COMMUNITY): Payer: Self-pay | Admitting: Nephrology

## 2021-12-18 DIAGNOSIS — I129 Hypertensive chronic kidney disease with stage 1 through stage 4 chronic kidney disease, or unspecified chronic kidney disease: Secondary | ICD-10-CM

## 2021-12-18 DIAGNOSIS — E875 Hyperkalemia: Secondary | ICD-10-CM

## 2021-12-18 DIAGNOSIS — N170 Acute kidney failure with tubular necrosis: Secondary | ICD-10-CM

## 2021-12-19 ENCOUNTER — Ambulatory Visit (HOSPITAL_COMMUNITY)
Admission: RE | Admit: 2021-12-19 | Discharge: 2021-12-19 | Disposition: A | Discharge status: Home / Self Care | Payer: Medicare Other | Source: Ambulatory Visit | Attending: Nephrology | Admitting: Nephrology

## 2021-12-19 DIAGNOSIS — N170 Acute kidney failure with tubular necrosis: Secondary | ICD-10-CM | POA: Insufficient documentation

## 2021-12-19 DIAGNOSIS — R601 Generalized edema: Secondary | ICD-10-CM | POA: Diagnosis not present

## 2021-12-19 DIAGNOSIS — N179 Acute kidney failure, unspecified: Secondary | ICD-10-CM | POA: Diagnosis not present

## 2021-12-19 DIAGNOSIS — R7303 Prediabetes: Secondary | ICD-10-CM | POA: Diagnosis not present

## 2021-12-19 DIAGNOSIS — I129 Hypertensive chronic kidney disease with stage 1 through stage 4 chronic kidney disease, or unspecified chronic kidney disease: Secondary | ICD-10-CM | POA: Diagnosis not present

## 2021-12-19 DIAGNOSIS — E875 Hyperkalemia: Secondary | ICD-10-CM | POA: Diagnosis not present

## 2021-12-19 DIAGNOSIS — R778 Other specified abnormalities of plasma proteins: Secondary | ICD-10-CM | POA: Diagnosis not present

## 2021-12-19 DIAGNOSIS — E559 Vitamin D deficiency, unspecified: Secondary | ICD-10-CM | POA: Diagnosis not present

## 2021-12-19 DIAGNOSIS — N1832 Chronic kidney disease, stage 3b: Secondary | ICD-10-CM | POA: Diagnosis not present

## 2021-12-19 DIAGNOSIS — R808 Other proteinuria: Secondary | ICD-10-CM | POA: Diagnosis not present

## 2021-12-19 DIAGNOSIS — E8809 Other disorders of plasma-protein metabolism, not elsewhere classified: Secondary | ICD-10-CM | POA: Diagnosis not present

## 2021-12-20 ENCOUNTER — Other Ambulatory Visit (HOSPITAL_COMMUNITY): Payer: Self-pay | Admitting: Nephrology

## 2021-12-20 ENCOUNTER — Other Ambulatory Visit: Payer: Self-pay | Admitting: Nephrology

## 2021-12-20 DIAGNOSIS — N1832 Chronic kidney disease, stage 3b: Secondary | ICD-10-CM

## 2021-12-20 DIAGNOSIS — R809 Proteinuria, unspecified: Secondary | ICD-10-CM

## 2021-12-20 DIAGNOSIS — N170 Acute kidney failure with tubular necrosis: Secondary | ICD-10-CM

## 2021-12-20 DIAGNOSIS — R601 Generalized edema: Secondary | ICD-10-CM

## 2021-12-27 ENCOUNTER — Ambulatory Visit (HOSPITAL_COMMUNITY): Admission: RE | Admit: 2021-12-27 | Payer: Medicare Other | Source: Ambulatory Visit

## 2022-01-09 ENCOUNTER — Other Ambulatory Visit: Payer: Self-pay | Admitting: Student

## 2022-01-09 ENCOUNTER — Other Ambulatory Visit: Payer: Self-pay | Admitting: Radiology

## 2022-01-09 DIAGNOSIS — N189 Chronic kidney disease, unspecified: Secondary | ICD-10-CM

## 2022-01-10 ENCOUNTER — Ambulatory Visit (HOSPITAL_COMMUNITY)
Admission: RE | Admit: 2022-01-10 | Discharge: 2022-01-10 | Disposition: A | Discharge status: Home / Self Care | Payer: Medicare Other | Source: Ambulatory Visit | Attending: Nephrology | Admitting: Nephrology

## 2022-01-10 DIAGNOSIS — N1832 Chronic kidney disease, stage 3b: Secondary | ICD-10-CM

## 2022-01-10 DIAGNOSIS — R809 Proteinuria, unspecified: Secondary | ICD-10-CM | POA: Diagnosis not present

## 2022-01-10 DIAGNOSIS — R601 Generalized edema: Secondary | ICD-10-CM | POA: Insufficient documentation

## 2022-01-10 DIAGNOSIS — N189 Chronic kidney disease, unspecified: Secondary | ICD-10-CM | POA: Diagnosis not present

## 2022-01-10 DIAGNOSIS — I129 Hypertensive chronic kidney disease with stage 1 through stage 4 chronic kidney disease, or unspecified chronic kidney disease: Secondary | ICD-10-CM | POA: Insufficient documentation

## 2022-01-10 DIAGNOSIS — N170 Acute kidney failure with tubular necrosis: Secondary | ICD-10-CM

## 2022-01-10 LAB — CBC
HCT: 35.8 % — ABNORMAL LOW (ref 39.0–52.0)
Hemoglobin: 12.3 g/dL — ABNORMAL LOW (ref 13.0–17.0)
MCH: 31.1 pg (ref 26.0–34.0)
MCHC: 34.4 g/dL (ref 30.0–36.0)
MCV: 90.6 fL (ref 80.0–100.0)
Platelets: 262 10*3/uL (ref 150–400)
RBC: 3.95 MIL/uL — ABNORMAL LOW (ref 4.22–5.81)
RDW: 12.2 % (ref 11.5–15.5)
WBC: 6.8 10*3/uL (ref 4.0–10.5)
nRBC: 0 % (ref 0.0–0.2)

## 2022-01-10 LAB — PROTIME-INR
INR: 0.9 (ref 0.8–1.2)
Prothrombin Time: 12.2 seconds (ref 11.4–15.2)

## 2022-01-10 MED ORDER — MIDAZOLAM HCL 2 MG/2ML IJ SOLN
INTRAMUSCULAR | Status: AC
  Filled 2022-01-10: qty 2

## 2022-01-10 MED ORDER — HYDRALAZINE HCL 20 MG/ML IJ SOLN
INTRAMUSCULAR | Status: AC
  Filled 2022-01-10: qty 1

## 2022-01-10 MED ORDER — MIDAZOLAM HCL 5 MG/5ML IJ SOLN
INTRAMUSCULAR | Status: AC | PRN
  Administered 2022-01-10: 1 mg via INTRAVENOUS

## 2022-01-10 MED ORDER — FENTANYL CITRATE (PF) 100 MCG/2ML IJ SOLN
INTRAMUSCULAR | Status: AC
  Filled 2022-01-10: qty 2

## 2022-01-10 MED ORDER — SODIUM CHLORIDE 0.9 % IV BOLUS
INTRAVENOUS | Status: AC | PRN
  Administered 2022-01-10: 500 mL via INTRAVENOUS

## 2022-01-10 MED ORDER — ATROPINE SULFATE 1 MG/10ML IJ SOSY
PREFILLED_SYRINGE | INTRAMUSCULAR | Status: AC
  Filled 2022-01-10: qty 10

## 2022-01-10 MED ORDER — GELATIN ABSORBABLE 12-7 MM EX MISC
CUTANEOUS | Status: AC
  Filled 2022-01-10: qty 1

## 2022-01-10 MED ORDER — LOSARTAN POTASSIUM 25 MG PO TABS
12.5000 mg | ORAL_TABLET | Freq: Once | ORAL | Status: AC
  Administered 2022-01-10: 12.5 mg via ORAL
  Filled 2022-01-10: qty 0.5

## 2022-01-10 MED ORDER — HYDRALAZINE HCL 20 MG/ML IJ SOLN
INTRAMUSCULAR | Status: AC | PRN
  Administered 2022-01-10: 10 mg via INTRAVENOUS

## 2022-01-10 MED ORDER — SODIUM CHLORIDE 0.9 % IV SOLN: INTRAVENOUS | Status: DC

## 2022-01-10 MED ORDER — FENTANYL CITRATE (PF) 100 MCG/2ML IJ SOLN
INTRAMUSCULAR | Status: AC | PRN
  Administered 2022-01-10 (×2): 50 ug via INTRAVENOUS

## 2022-01-10 MED ORDER — SODIUM CHLORIDE 0.9 % IV SOLN
INTRAVENOUS | Status: AC | PRN
  Administered 2022-01-10: 10 mL/h via INTRAVENOUS

## 2022-01-10 MED ORDER — LIDOCAINE HCL (PF) 1 % IJ SOLN
INTRAMUSCULAR | Status: AC
  Filled 2022-01-10: qty 30

## 2022-01-10 NOTE — Sedation Documentation (Signed)
Bolus 500cc complete. Patient feels much better. VS back to pre procedure levels. Alert oriented, skin pink warm and dry. Turned onto back at 0925.

## 2022-01-10 NOTE — Sedation Documentation (Signed)
Patient feels hot. Blankets removed from legs. Heart rate 50 and SBP 77, pink and skin warm and dry. Query having vaso vagal. Dr Milford Cage aware.

## 2022-01-15 ENCOUNTER — Other Ambulatory Visit (HOSPITAL_COMMUNITY)
Admission: RE | Admit: 2022-01-15 | Discharge: 2022-01-15 | Disposition: A | Discharge status: Home / Self Care | Payer: Medicare Other | Attending: Nephrology | Admitting: Nephrology

## 2022-01-15 DIAGNOSIS — N046 Nephrotic syndrome with dense deposit disease: Secondary | ICD-10-CM | POA: Diagnosis not present

## 2022-01-15 DIAGNOSIS — R778 Other specified abnormalities of plasma proteins: Secondary | ICD-10-CM | POA: Insufficient documentation

## 2022-01-15 DIAGNOSIS — R7303 Prediabetes: Secondary | ICD-10-CM | POA: Insufficient documentation

## 2022-01-15 DIAGNOSIS — N170 Acute kidney failure with tubular necrosis: Secondary | ICD-10-CM | POA: Insufficient documentation

## 2022-01-15 DIAGNOSIS — N1832 Chronic kidney disease, stage 3b: Secondary | ICD-10-CM | POA: Diagnosis not present

## 2022-01-15 DIAGNOSIS — N189 Chronic kidney disease, unspecified: Secondary | ICD-10-CM | POA: Diagnosis not present

## 2022-01-15 DIAGNOSIS — R601 Generalized edema: Secondary | ICD-10-CM | POA: Insufficient documentation

## 2022-01-15 DIAGNOSIS — D631 Anemia in chronic kidney disease: Secondary | ICD-10-CM | POA: Diagnosis not present

## 2022-01-15 DIAGNOSIS — I129 Hypertensive chronic kidney disease with stage 1 through stage 4 chronic kidney disease, or unspecified chronic kidney disease: Secondary | ICD-10-CM | POA: Insufficient documentation

## 2022-01-15 DIAGNOSIS — R808 Other proteinuria: Secondary | ICD-10-CM | POA: Diagnosis not present

## 2022-01-15 LAB — RENAL FUNCTION PANEL
Albumin: 2.2 g/dL — ABNORMAL LOW (ref 3.5–5.0)
Anion gap: 11 (ref 5–15)
BUN: 76 mg/dL — ABNORMAL HIGH (ref 8–23)
CO2: 29 mmol/L (ref 22–32)
Calcium: 8.2 mg/dL — ABNORMAL LOW (ref 8.9–10.3)
Chloride: 101 mmol/L (ref 98–111)
Creatinine, Ser: 2.54 mg/dL — ABNORMAL HIGH (ref 0.61–1.24)
GFR, Estimated: 27 mL/min — ABNORMAL LOW (ref >60)
Glucose, Bld: 123 mg/dL — ABNORMAL HIGH (ref 70–99)
Phosphorus: 3.5 mg/dL (ref 2.5–4.6)
Potassium: 3.5 mmol/L (ref 3.5–5.1)
Sodium: 141 mmol/L (ref 135–145)

## 2022-01-16 ENCOUNTER — Encounter (HOSPITAL_COMMUNITY): Payer: Self-pay

## 2022-01-16 LAB — SURGICAL PATHOLOGY

## 2022-01-17 ENCOUNTER — Encounter (HOSPITAL_COMMUNITY): Payer: Self-pay

## 2022-01-20 ENCOUNTER — Other Ambulatory Visit (HOSPITAL_COMMUNITY): Payer: Self-pay | Admitting: Nephrology

## 2022-01-20 ENCOUNTER — Ambulatory Visit (HOSPITAL_COMMUNITY)
Admission: RE | Admit: 2022-01-20 | Discharge: 2022-01-20 | Disposition: A | Discharge status: Home / Self Care | Payer: Medicare Other | Source: Ambulatory Visit | Attending: Nephrology | Admitting: Nephrology

## 2022-01-20 DIAGNOSIS — N189 Chronic kidney disease, unspecified: Secondary | ICD-10-CM | POA: Diagnosis not present

## 2022-01-20 DIAGNOSIS — N1832 Chronic kidney disease, stage 3b: Secondary | ICD-10-CM | POA: Insufficient documentation

## 2022-01-20 DIAGNOSIS — R059 Cough, unspecified: Secondary | ICD-10-CM | POA: Diagnosis not present

## 2022-01-20 DIAGNOSIS — R079 Chest pain, unspecified: Secondary | ICD-10-CM | POA: Diagnosis not present

## 2022-01-28 DIAGNOSIS — R053 Chronic cough: Secondary | ICD-10-CM | POA: Diagnosis not present

## 2022-02-10 ENCOUNTER — Other Ambulatory Visit (HOSPITAL_COMMUNITY)
Admission: RE | Admit: 2022-02-10 | Discharge: 2022-02-10 | Disposition: A | Discharge status: Home / Self Care | Payer: Medicare Other | Source: Ambulatory Visit | Attending: Nephrology | Admitting: Nephrology

## 2022-02-10 DIAGNOSIS — N1832 Chronic kidney disease, stage 3b: Secondary | ICD-10-CM | POA: Diagnosis not present

## 2022-02-10 LAB — PROTEIN / CREATININE RATIO, URINE
Creatinine, Urine: 103.92 mg/dL
Protein Creatinine Ratio: 6.82 mg/mg{Cre} — ABNORMAL HIGH (ref 0.00–0.15)
Total Protein, Urine: 709 mg/dL

## 2022-02-10 LAB — CBC
HCT: 34.5 % — ABNORMAL LOW (ref 39.0–52.0)
Hemoglobin: 11.1 g/dL — ABNORMAL LOW (ref 13.0–17.0)
MCH: 30.6 pg (ref 26.0–34.0)
MCHC: 32.2 g/dL (ref 30.0–36.0)
MCV: 95 fL (ref 80.0–100.0)
Platelets: 282 10*3/uL (ref 150–400)
RBC: 3.63 MIL/uL — ABNORMAL LOW (ref 4.22–5.81)
RDW: 12.6 % (ref 11.5–15.5)
WBC: 7.3 10*3/uL (ref 4.0–10.5)
nRBC: 0 % (ref 0.0–0.2)

## 2022-02-10 LAB — RENAL FUNCTION PANEL
Albumin: 2.1 g/dL — ABNORMAL LOW (ref 3.5–5.0)
Anion gap: 4 — ABNORMAL LOW (ref 5–15)
BUN: 43 mg/dL — ABNORMAL HIGH (ref 8–23)
CO2: 29 mmol/L (ref 22–32)
Calcium: 8.1 mg/dL — ABNORMAL LOW (ref 8.9–10.3)
Chloride: 108 mmol/L (ref 98–111)
Creatinine, Ser: 2.44 mg/dL — ABNORMAL HIGH (ref 0.61–1.24)
GFR, Estimated: 28 mL/min — ABNORMAL LOW (ref >60)
Glucose, Bld: 118 mg/dL — ABNORMAL HIGH (ref 70–99)
Phosphorus: 3.4 mg/dL (ref 2.5–4.6)
Potassium: 4.8 mmol/L (ref 3.5–5.1)
Sodium: 141 mmol/L (ref 135–145)

## 2022-02-12 DIAGNOSIS — R808 Other proteinuria: Secondary | ICD-10-CM | POA: Diagnosis not present

## 2022-02-12 DIAGNOSIS — N1832 Chronic kidney disease, stage 3b: Secondary | ICD-10-CM | POA: Diagnosis not present

## 2022-02-12 DIAGNOSIS — R601 Generalized edema: Secondary | ICD-10-CM | POA: Diagnosis not present

## 2022-02-12 DIAGNOSIS — I129 Hypertensive chronic kidney disease with stage 1 through stage 4 chronic kidney disease, or unspecified chronic kidney disease: Secondary | ICD-10-CM | POA: Diagnosis not present

## 2022-02-12 DIAGNOSIS — N053 Unspecified nephritic syndrome with diffuse mesangial proliferative glomerulonephritis: Secondary | ICD-10-CM | POA: Diagnosis not present

## 2022-02-24 DIAGNOSIS — R053 Chronic cough: Secondary | ICD-10-CM | POA: Diagnosis not present

## 2022-02-24 DIAGNOSIS — R208 Other disturbances of skin sensation: Secondary | ICD-10-CM | POA: Diagnosis not present

## 2022-02-25 DIAGNOSIS — D485 Neoplasm of uncertain behavior of skin: Secondary | ICD-10-CM | POA: Diagnosis not present

## 2022-02-25 DIAGNOSIS — C4442 Squamous cell carcinoma of skin of scalp and neck: Secondary | ICD-10-CM | POA: Diagnosis not present

## 2022-02-27 ENCOUNTER — Other Ambulatory Visit (HOSPITAL_COMMUNITY): Payer: Self-pay | Admitting: Respiratory Therapy

## 2022-02-27 DIAGNOSIS — R053 Chronic cough: Secondary | ICD-10-CM

## 2022-03-03 ENCOUNTER — Ambulatory Visit: Payer: Medicare Other | Admitting: Infectious Disease

## 2022-03-03 ENCOUNTER — Other Ambulatory Visit: Payer: Self-pay

## 2022-03-03 ENCOUNTER — Encounter: Payer: Self-pay | Admitting: Infectious Disease

## 2022-03-03 VITALS — BP 133/75 | HR 80 | Temp 98.3°F | Wt 168.4 lb

## 2022-03-03 DIAGNOSIS — R809 Proteinuria, unspecified: Secondary | ICD-10-CM | POA: Diagnosis not present

## 2022-03-03 DIAGNOSIS — Z23 Encounter for immunization: Secondary | ICD-10-CM

## 2022-03-03 DIAGNOSIS — A6920 Lyme disease, unspecified: Secondary | ICD-10-CM | POA: Diagnosis not present

## 2022-03-03 DIAGNOSIS — N053 Unspecified nephritic syndrome with diffuse mesangial proliferative glomerulonephritis: Secondary | ICD-10-CM

## 2022-03-03 DIAGNOSIS — A770 Spotted fever due to Rickettsia rickettsii: Secondary | ICD-10-CM | POA: Diagnosis not present

## 2022-03-03 DIAGNOSIS — Z114 Encounter for screening for human immunodeficiency virus [HIV]: Secondary | ICD-10-CM

## 2022-03-03 NOTE — Progress Notes (Signed)
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  He has had autoimmune labs done at his nephrologist office and has had hepatitis B surface antigen and surface antibody and hepatitis C antibodies checked which were all negative. Initially I had thought he had not had HIV test--but he DID and it was negative.  He had zero symptoms to suggest streptococcal pharyngitis or streptococcal cellulitis or to the onset of his renal disease.  He also has no evidence clinically to suggest ongoing infection due to this organism so I do not feel there is much merit ducking ASO or anti-DNase antibodies.  He did have some symptoms of coughing this spring which she attributed to allergies and they have persisted and he is being treated for gastroesophageal reflux disease.  He has had some pain in his teeth with intense coughing but otherwise no dental problems.  He has not had fevers chills or drenching night sweats he is not used intravenous drugs.  He is divorced and not been sexually active.  He did apparently have a tick bite with a "bull's-eye rash that was not treated with antibiotics--which is surprising to me since I would think someone would err on the side of caution and treat empirically for Lyme in the context of rash that could be erythema migrans.  I suspect it was more likely Southern tick associated rash illness.   He  has been started on dofetilide by nephrology.     Past Medical History:  Diagnosis Date   Bilateral inguinal hernia    Sleep apnea 04/2020   uses CPAP    Past Surgical History:  Procedure Laterality Date   COLONOSCOPY  2006   EYE SURGERY Bilateral    lasik   INGUINAL HERNIA REPAIR Bilateral 06/12/2020   Procedure: LAPAROSCOPIC BILATERAL INGUINAL HERNIA REPAIR WITH MESH;  Surgeon: Alphonsa Overall, MD;  Location: [facility];  Service: General;  Laterality: Bilateral;   INSERTION OF MESH Bilateral 06/12/2020   Procedure: INSERTION OF MESH;  Surgeon: Alphonsa Overall, MD;  Location: [place];  Service: General;  Laterality: Bilateral;   [place] and 2000   neck/spinal fusion w plates and screws   SKIN GRAFT  1975   inner thigh to foot    Family History  Problem Relation Age of Onset   Colon cancer Neg Hx       Social History   Socioeconomic History   Marital status: Divorced    Spouse name: Not on file   Number of children: Not on file   Years of education: Not on file   Highest education level: Not on file  Occupational History   Not on file  Tobacco Use   Smoking status: Never   Smokeless tobacco: Never  Substance and Sexual Activity   Alcohol use: Yes    Alcohol/week: 1.0 standard drink of alcohol    Types: 1 Standard drinks or equivalent per week    Comment: rarely   Drug  use: Never   Sexual activity: Not on file  Other Topics Concern   Not on file  Social History Narrative   Not on file   Social Determinants of Health   Financial Resource Strain: Not on file  Food Insecurity: Not on file  Transportation Needs: Not on file  Physical Activity: Not on file  Stress: Not on file  Social Connections: Not on file    No Known Allergies   Current Outpatient Medications:    losartan (COZAAR) 25 MG tablet, Take 12.5 mg by mouth daily., Disp: , Rfl:    metolazone (ZAROXOLYN) 5 MG tablet, Take 5 mg by mouth daily.,  Disp: , Rfl:    torsemide (DEMADEX) 100 MG tablet, Take 100 mg by mouth daily., Disp: , Rfl:     Review of Systems  Constitutional:  Negative for activity change, appetite change, chills, diaphoresis, fatigue, fever and unexpected weight change.  HENT:  Negative for congestion, rhinorrhea, sinus pressure, sneezing, sore throat and trouble swallowing.   Eyes:  Negative for photophobia and visual disturbance.  Respiratory:  Positive for cough. Negative for chest tightness, shortness of breath, wheezing and stridor.   Cardiovascular:  Negative for chest pain, palpitations and leg swelling.  Gastrointestinal:  Negative for abdominal distention, abdominal pain, anal bleeding, blood in stool, constipation, diarrhea, nausea and vomiting.  Genitourinary:  Negative for difficulty urinating, dysuria, flank pain and hematuria.  Musculoskeletal:  Negative for arthralgias, back pain, gait problem, joint swelling and myalgias.  Skin:  Negative for color change, pallor, rash and wound.  Neurological:  Negative for dizziness, tremors, weakness and light-headedness.  Hematological:  Negative for adenopathy. Does not bruise/bleed easily.  Psychiatric/Behavioral:  Negative for agitation, behavioral problems, confusion, decreased concentration, dysphoric mood and sleep disturbance.        Objective:   Physical Exam Constitutional:      Appearance: He is well-developed.  HENT:     Head: Normocephalic and atraumatic.     Mouth/Throat:     Lips: Pink.     Mouth: Mucous membranes are moist. No injury, lacerations, oral lesions or angioedema.     Dentition: Normal dentition. Does not have dentures. No dental tenderness, gingival swelling or dental abscesses.     Pharynx: Oropharynx is clear. Uvula midline. No pharyngeal swelling, oropharyngeal exudate, posterior oropharyngeal erythema or uvula swelling.  Eyes:     Conjunctiva/sclera: Conjunctivae normal.  Cardiovascular:     Rate and Rhythm: Normal rate  and regular rhythm.     Heart sounds: No murmur heard.    No friction rub. No gallop.  Pulmonary:     Effort: Pulmonary effort is normal. No respiratory distress.     Breath sounds: Normal breath sounds. No stridor. No wheezing or rhonchi.  Abdominal:     General: There is no distension.     Palpations: Abdomen is soft.  Musculoskeletal:        General: No tenderness. Normal range of motion.     Cervical back: Normal range of motion and neck supple.  Skin:    General: Skin is warm and dry.     Coloration: Skin is not pale.     Findings: No erythema or rash.  Neurological:     General: No focal deficit present.     Mental Status: He is alert and oriented to person, place, and time.  Psychiatric:        Mood and Affect: Mood normal.        Behavior: Behavior normal.  Thought Content: Thought content normal.        Judgment: Judgment normal.           Assessment & Plan:   Mesangioproliferative glomerulonephritis:  Not have any clear cut infectious etiology.  I will check a set of blood cultures from 2 sites as well as order 2D echocardiogram in case he had an occult endocarditis causing his infection.  I do not think the story is very good for that though based on his symptoms.  He also has no evidence on exam for stigmata of endocarditis  Hx of "Bulls eye rash" I suspect this was STARI since he got better and did not receive antibiotics.  But I will check serologies for early burgdorferi.  Hepatitis B non immune, vaccine counseling: we gave him first dose of hepatitis vaccine and he will return for next one in one month  I spent 81 minutes with the patient including than 50% of the time in face to face counseling of the patient the differential for infectious causes of mesangioproliferative glomerulonephritis  along with review of medical records in preparation for the visit and during the visit and in coordination of his care.

## 2022-03-03 NOTE — Patient Instructions (Signed)
We will give you first hepatitis vaccine today and repeat one in one months time

## 2022-03-04 ENCOUNTER — Telehealth: Payer: Self-pay

## 2022-03-04 ENCOUNTER — Ambulatory Visit (HOSPITAL_COMMUNITY)
Admission: RE | Admit: 2022-03-04 | Discharge: 2022-03-04 | Disposition: A | Discharge status: Home / Self Care | Payer: Medicare Other | Source: Ambulatory Visit | Attending: Infectious Disease | Admitting: Infectious Disease

## 2022-03-04 DIAGNOSIS — E785 Hyperlipidemia, unspecified: Secondary | ICD-10-CM | POA: Diagnosis not present

## 2022-03-04 DIAGNOSIS — N053 Unspecified nephritic syndrome with diffuse mesangial proliferative glomerulonephritis: Secondary | ICD-10-CM | POA: Insufficient documentation

## 2022-03-04 DIAGNOSIS — R509 Fever, unspecified: Secondary | ICD-10-CM | POA: Insufficient documentation

## 2022-03-04 DIAGNOSIS — R809 Proteinuria, unspecified: Secondary | ICD-10-CM | POA: Diagnosis not present

## 2022-03-04 DIAGNOSIS — I119 Hypertensive heart disease without heart failure: Secondary | ICD-10-CM | POA: Insufficient documentation

## 2022-03-04 LAB — ECHOCARDIOGRAM COMPLETE
AR max vel: 3.34 cm2
AV Area VTI: 3.34 cm2
AV Area mean vel: 3.34 cm2
AV Mean grad: 3 mmHg
AV Peak grad: 5.9 mmHg
Ao pk vel: 1.21 m/s
Area-P 1/2: 5.46 cm2
Calc EF: 59.6 %
MV VTI: 4.7 cm2
S' Lateral: 3.5 cm
Single Plane A2C EF: 51.1 %
Single Plane A4C EF: 62.2 %

## 2022-03-04 NOTE — Telephone Encounter (Signed)
Called patient to review Echo results. Was able to connect and relay results after confirming I was speaking to patient.  Would like to know how long until culture results are back. Informed him it could take a few days before results are finalized, but office would call once provider reviewed them.  Did not have any additional questions at this time Luke Hoffman, RMA

## 2022-03-04 NOTE — Telephone Encounter (Signed)
-----   Message from Truman Hayward, MD sent at 03/04/2022 12:04 PM EDT ----- Patients echo shows zero evidence of heart valve infection ----- Message ----- From: Interface, [employer] Sent: 03/04/2022  11:33 AM EDT To: Truman Hayward, MD

## 2022-03-06 DIAGNOSIS — C4442 Squamous cell carcinoma of skin of scalp and neck: Secondary | ICD-10-CM | POA: Diagnosis not present

## 2022-03-07 ENCOUNTER — Ambulatory Visit (HOSPITAL_COMMUNITY)
Admission: RE | Admit: 2022-03-07 | Discharge: 2022-03-07 | Disposition: A | Discharge status: Home / Self Care | Payer: Medicare Other | Source: Ambulatory Visit | Attending: Family Medicine | Admitting: Family Medicine

## 2022-03-07 DIAGNOSIS — R053 Chronic cough: Secondary | ICD-10-CM | POA: Diagnosis not present

## 2022-03-07 LAB — PULMONARY FUNCTION TEST
DL/VA % pred: 83 %
DL/VA: 3.47 ml/min/mmHg/L
DLCO unc % pred: 85 %
DLCO unc: 20.47 ml/min/mmHg
FEF 25-75 Post: 2.72 L/sec
FEF 25-75 Pre: 2.01 L/sec
FEF2575-%Change-Post: 35 %
FEF2575-%Pred-Post: 115 %
FEF2575-%Pred-Pre: 85 %
FEV1-%Change-Post: 6 %
FEV1-%Pred-Post: 97 %
FEV1-%Pred-Pre: 91 %
FEV1-Post: 2.92 L
FEV1-Pre: 2.73 L
FEV1FVC-%Change-Post: 6 %
FEV1FVC-%Pred-Pre: 99 %
FEV6-%Change-Post: 0 %
FEV6-%Pred-Post: 97 %
FEV6-%Pred-Pre: 97 %
FEV6-Post: 3.71 L
FEV6-Pre: 3.7 L
FEV6FVC-%Change-Post: 0 %
FEV6FVC-%Pred-Post: 105 %
FEV6FVC-%Pred-Pre: 105 %
FVC-%Change-Post: 0 %
FVC-%Pred-Post: 92 %
FVC-%Pred-Pre: 91 %
FVC-Post: 3.72 L
FVC-Pre: 3.7 L
Post FEV1/FVC ratio: 79 %
Post FEV6/FVC ratio: 100 %
Pre FEV1/FVC ratio: 74 %
Pre FEV6/FVC Ratio: 100 %
RV % pred: 108 %
RV: 2.4 L
TLC % pred: 95 %
TLC: 6.15 L

## 2022-03-07 MED ORDER — ALBUTEROL SULFATE (2.5 MG/3ML) 0.083% IN NEBU
2.5000 mg | INHALATION_SOLUTION | Freq: Once | RESPIRATORY_TRACT | Status: AC
  Administered 2022-03-07: 2.5 mg via RESPIRATORY_TRACT

## 2022-03-09 LAB — CULTURE, BLOOD (SINGLE)
MICRO NUMBER:: 13776001
MICRO NUMBER:: 13776002
Result:: NO GROWTH
Result:: NO GROWTH
SPECIMEN QUALITY:: ADEQUATE
SPECIMEN QUALITY:: ADEQUATE

## 2022-03-09 LAB — B. BURGDORFI ANTIBODIES: B burgdorferi Ab IgG+IgM: <0.9 index

## 2022-03-13 DIAGNOSIS — N1832 Chronic kidney disease, stage 3b: Secondary | ICD-10-CM | POA: Diagnosis not present

## 2022-03-13 DIAGNOSIS — R601 Generalized edema: Secondary | ICD-10-CM | POA: Diagnosis not present

## 2022-03-13 DIAGNOSIS — I129 Hypertensive chronic kidney disease with stage 1 through stage 4 chronic kidney disease, or unspecified chronic kidney disease: Secondary | ICD-10-CM | POA: Diagnosis not present

## 2022-03-13 DIAGNOSIS — R808 Other proteinuria: Secondary | ICD-10-CM | POA: Diagnosis not present

## 2022-03-13 DIAGNOSIS — N046 Nephrotic syndrome with dense deposit disease: Secondary | ICD-10-CM | POA: Diagnosis not present

## 2022-03-19 DIAGNOSIS — I129 Hypertensive chronic kidney disease with stage 1 through stage 4 chronic kidney disease, or unspecified chronic kidney disease: Secondary | ICD-10-CM | POA: Diagnosis not present

## 2022-03-19 DIAGNOSIS — R601 Generalized edema: Secondary | ICD-10-CM | POA: Diagnosis not present

## 2022-03-19 DIAGNOSIS — E559 Vitamin D deficiency, unspecified: Secondary | ICD-10-CM | POA: Diagnosis not present

## 2022-03-19 DIAGNOSIS — N046 Nephrotic syndrome with dense deposit disease: Secondary | ICD-10-CM | POA: Diagnosis not present

## 2022-03-19 DIAGNOSIS — D631 Anemia in chronic kidney disease: Secondary | ICD-10-CM | POA: Diagnosis not present

## 2022-03-19 DIAGNOSIS — N189 Chronic kidney disease, unspecified: Secondary | ICD-10-CM | POA: Diagnosis not present

## 2022-03-19 DIAGNOSIS — N053 Unspecified nephritic syndrome with diffuse mesangial proliferative glomerulonephritis: Secondary | ICD-10-CM | POA: Diagnosis not present

## 2022-03-19 DIAGNOSIS — I5032 Chronic diastolic (congestive) heart failure: Secondary | ICD-10-CM | POA: Diagnosis not present

## 2022-03-19 DIAGNOSIS — R808 Other proteinuria: Secondary | ICD-10-CM | POA: Diagnosis not present

## 2022-03-19 DIAGNOSIS — N1832 Chronic kidney disease, stage 3b: Secondary | ICD-10-CM | POA: Diagnosis not present

## 2022-03-19 DIAGNOSIS — E870 Hyperosmolality and hypernatremia: Secondary | ICD-10-CM | POA: Diagnosis not present

## 2022-03-25 DIAGNOSIS — G4733 Obstructive sleep apnea (adult) (pediatric): Secondary | ICD-10-CM | POA: Diagnosis not present

## 2022-03-25 DIAGNOSIS — I10 Essential (primary) hypertension: Secondary | ICD-10-CM | POA: Diagnosis not present

## 2022-03-25 DIAGNOSIS — Z23 Encounter for immunization: Secondary | ICD-10-CM | POA: Diagnosis not present

## 2022-03-25 DIAGNOSIS — M79604 Pain in right leg: Secondary | ICD-10-CM | POA: Diagnosis not present

## 2022-03-25 DIAGNOSIS — Z0001 Encounter for general adult medical examination with abnormal findings: Secondary | ICD-10-CM | POA: Diagnosis not present

## 2022-03-25 DIAGNOSIS — E875 Hyperkalemia: Secondary | ICD-10-CM | POA: Diagnosis not present

## 2022-03-25 DIAGNOSIS — K219 Gastro-esophageal reflux disease without esophagitis: Secondary | ICD-10-CM | POA: Diagnosis not present

## 2022-03-25 DIAGNOSIS — N1832 Chronic kidney disease, stage 3b: Secondary | ICD-10-CM | POA: Diagnosis not present

## 2022-03-25 DIAGNOSIS — E559 Vitamin D deficiency, unspecified: Secondary | ICD-10-CM | POA: Diagnosis not present

## 2022-03-25 DIAGNOSIS — R978 Other abnormal tumor markers: Secondary | ICD-10-CM | POA: Diagnosis not present

## 2022-03-25 DIAGNOSIS — R809 Proteinuria, unspecified: Secondary | ICD-10-CM | POA: Diagnosis not present

## 2022-03-25 DIAGNOSIS — R053 Chronic cough: Secondary | ICD-10-CM | POA: Diagnosis not present

## 2022-04-03 ENCOUNTER — Ambulatory Visit (INDEPENDENT_AMBULATORY_CARE_PROVIDER_SITE_OTHER): Payer: Medicare Other

## 2022-04-03 ENCOUNTER — Other Ambulatory Visit: Payer: Self-pay

## 2022-04-03 DIAGNOSIS — Z23 Encounter for immunization: Secondary | ICD-10-CM | POA: Diagnosis not present

## 2022-04-15 ENCOUNTER — Ambulatory Visit: Payer: Medicare Other | Admitting: Internal Medicine

## 2022-04-15 ENCOUNTER — Encounter: Payer: Self-pay | Admitting: Internal Medicine

## 2022-04-15 DIAGNOSIS — R053 Chronic cough: Secondary | ICD-10-CM

## 2022-04-15 MED ORDER — FAMOTIDINE 20 MG PO TABS: ORAL_TABLET | ORAL | 11 refills | Status: AC

## 2022-04-15 NOTE — Patient Instructions (Addendum)
Dexilant 60 mg continue to Take 30-60 min before first meal of the day and add  pepcid 20 mg one after supper   GERD (REFLUX)  is an extremely common cause of respiratory symptoms just like yours , many times with no obvious heartburn at all.    It can be treated with medication, but also with lifestyle changes including elevation of the head of your bed (ideally with 6 -8inch blocks under the headboard of your bed),  Smoking cessation, avoidance of late meals, excessive alcohol, and avoid fatty foods, chocolate, peppermint, colas, red wine, and acidic juices such as orange juice.  NO MINT OR MENTHOL PRODUCTS SO NO COUGH DROPS  USE SUGARLESS CANDY INSTEAD (Jolley ranchers or Stover's or Life Savers) or even ice chips will also do - the key is to swallow to prevent all throat clearing. NO OIL BASED VITAMINS - use powdered substitutes.  Avoid fish oil when coughing.    My office will be contacting you by phone for referral for high resolution CT chest and sinus and ENT referral   - if you don't hear back from my office within one week please call us back or notify us thru MyChart and we'll address it right away.    Ultimately you may need a surgery called [employer]

## 2022-04-15 NOTE — Progress Notes (Unsigned)
   Luke Hoffman, male    DOB: 03-06-1956    MRN: [id number]   Brief patient profile:  25   yowm neversmoker referred to pulmonary clinic in [place]  04/15/2022 by Otilio Miu NP for cough winter 2023.      History of Present Illness  04/15/2022  Pulmonary/ 1st office eval/ [facility] / [place] [facility]  Chief Complaint  Patient presents with   Consult    Consult for chronic cough about 6 months.  PFTs done 03/07/22.    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     FARXIGA 5 MG TABS tablet SMARTSIG:5 Milligram(s) By Mouth Every Morning     losartan (COZAAR) 25 MG tablet Take 12.5 mg by mouth daily.     mycophenolate (CELLCEPT) 500 MG tablet Take 500 mg by mouth 2 (two) times daily.     torsemide (DEMADEX) 100 MG tablet Take 100 mg by mouth daily.     metolazone (ZAROXOLYN) 5 MG tablet Take 5 mg by mouth daily.                Objective:     BP 136/84 (BP Location: Left Arm, Patient Position: Sitting)   Pulse 66   Temp 97.7 F (36.5 C) (Temporal)   Ht '5\' 7"'$  (1.702 m)   Wt 169 lb 9.6 oz (76.9 kg)   SpO2 97% Comment: ra  BMI 26.56 kg/m   SpO2: 97 % (ra)   Amb somber wm nad    HEENT : Oropharynx  clear     Nasal turbinates nl    NECK :  without  apparent JVD/ palpable Nodes/TM    LUNGS: no acc muscle use,  Nl contour chest which is  clear to A and P bilaterally without cough on insp or exp maneuvers   CV:  RRR  no s3 or murmur or increase in P2, and no edema   ABD:  soft and nontender with nl inspiratory excursion in the supine position. No bruits or organomegaly appreciated   MS:  Nl gait/ ext warm without deformities Or obvious joint restrictions  calf tenderness, cyanosis or clubbing    SKIN: warm and dry without lesions    NEURO:  alert, approp, nl sensorium with  no motor or cerebellar deficits apparent.       Assessment   No problem-specific Assessment & Plan notes found for this encounter.     Christinia Gully, MD 04/15/2022

## 2022-04-16 ENCOUNTER — Encounter: Payer: Self-pay | Admitting: Internal Medicine

## 2022-04-16 NOTE — Assessment & Plan Note (Addendum)
Onset late winter 2023 with overt HB/am nasal congestion and cough at end inspiration  - CT cuts on ABD CT 12/11/21 Lower chest: Bibasilar scarring and atelectasis - PFT's  02/27/22 wnl  - 04/15/2022 Rec:  Sinus CT/ hrct and ENTeval for globus  The most common causes of chronic cough in immunocompetent adults include the following: upper airway cough syndrome (UACS), previously referred to as postnasal drip syndrome (PNDS), which is caused by variety of rhinosinus conditions; (2) asthma; (3) GERD; (4) chronic bronchitis from cigarette smoking or other inhaled environmental irritants; (5) nonasthmatic eosinophilic bronchitis; and (6) bronchiectasis.   These conditions, singly or in combination, have accounted for up to 94% of the causes of chronic cough in prospective studies.   Other conditions have constituted no >6% of the causes in prospective studies These have included bronchogenic carcinoma, chronic interstitial pneumonia, sarcoidosis, left ventricular failure, ACEI-induced cough, and aspiration from a condition associated with pharyngeal dysfunction.    Chronic cough is often simultaneously caused by more than one condition. A single cause has been found from 38 to 82% of the time, multiple causes from 18 to 62%. Multiply caused cough has been the result of three diseases up to 42% of the time.   The cough at end insp and findings on abd ct are worrisome for ILD but ddx includes bronchiectasis from bad gerd and assoc chronic (longstanding) sinusitis so.  Of the three most common causes of  Sub-acute / recurrent or chronic cough, only one (GERD)  can actually contribute to/ trigger  the other two (asthma and post nasal drip syndrome)  and perpetuate the cylce of cough.  While not intuitively obvious, many patients with chronic low grade reflux do not cough until there is a primary insult that disturbs the protective epithelial barrier and exposes sensitive nerve endings.   This is typically  viral but can due to [place] more likely to  apply here with assoc globus sensation     >>> The point is that once this occurs, it is difficult to eliminate the cycle  using anything but a maximally effective acid suppression regimen at least in the short run, accompanied by an appropriate diet to address non acid GERD and control / eliminate the urge to clear the throat starting with simple hard rock candies but avoiding mint, menthol or chocalate per gerd diet given along with bed blocks  Pulmonary f/u can be prn depending on findings          Each maintenance medication was reviewed in detail including emphasizing most importantly the difference between maintenance and prns and under what circumstances the prns are to be triggered using an action plan format where appropriate.  Total time for H and P, chart review, counseling,  device(s) and generating customized AVS unique to this office visit / same day charting = 37 min new pt eval

## 2022-06-02 ENCOUNTER — Ambulatory Visit (HOSPITAL_COMMUNITY)
Admission: RE | Admit: 2022-06-02 | Discharge: 2022-06-02 | Disposition: A | Discharge status: Home / Self Care | Payer: Medicare Other | Source: Ambulatory Visit | Attending: Internal Medicine | Admitting: Internal Medicine

## 2022-06-02 DIAGNOSIS — R053 Chronic cough: Secondary | ICD-10-CM | POA: Insufficient documentation

## 2022-06-02 DIAGNOSIS — R059 Cough, unspecified: Secondary | ICD-10-CM | POA: Diagnosis not present

## 2022-06-02 DIAGNOSIS — L0201 Cutaneous abscess of face: Secondary | ICD-10-CM | POA: Diagnosis not present

## 2022-06-02 DIAGNOSIS — R058 Other specified cough: Secondary | ICD-10-CM | POA: Diagnosis not present

## 2022-06-24 DIAGNOSIS — L821 Other seborrheic keratosis: Secondary | ICD-10-CM | POA: Diagnosis not present

## 2022-06-24 DIAGNOSIS — C44329 Squamous cell carcinoma of skin of other parts of face: Secondary | ICD-10-CM | POA: Diagnosis not present

## 2022-06-24 DIAGNOSIS — L578 Other skin changes due to chronic exposure to nonionizing radiation: Secondary | ICD-10-CM | POA: Diagnosis not present

## 2022-06-24 DIAGNOSIS — L814 Other melanin hyperpigmentation: Secondary | ICD-10-CM | POA: Diagnosis not present

## 2022-06-24 DIAGNOSIS — Z86018 Personal history of other benign neoplasm: Secondary | ICD-10-CM | POA: Diagnosis not present

## 2022-06-24 DIAGNOSIS — D485 Neoplasm of uncertain behavior of skin: Secondary | ICD-10-CM | POA: Diagnosis not present

## 2022-06-24 DIAGNOSIS — L570 Actinic keratosis: Secondary | ICD-10-CM | POA: Diagnosis not present

## 2022-06-24 DIAGNOSIS — D225 Melanocytic nevi of trunk: Secondary | ICD-10-CM | POA: Diagnosis not present

## 2022-06-24 DIAGNOSIS — L565 Disseminated superficial actinic porokeratosis (DSAP): Secondary | ICD-10-CM | POA: Diagnosis not present

## 2022-07-02 DIAGNOSIS — R808 Other proteinuria: Secondary | ICD-10-CM | POA: Diagnosis not present

## 2022-07-02 DIAGNOSIS — D631 Anemia in chronic kidney disease: Secondary | ICD-10-CM | POA: Diagnosis not present

## 2022-07-02 DIAGNOSIS — N1832 Chronic kidney disease, stage 3b: Secondary | ICD-10-CM | POA: Diagnosis not present

## 2022-07-02 DIAGNOSIS — R601 Generalized edema: Secondary | ICD-10-CM | POA: Diagnosis not present

## 2022-07-02 DIAGNOSIS — N189 Chronic kidney disease, unspecified: Secondary | ICD-10-CM | POA: Diagnosis not present

## 2022-07-02 DIAGNOSIS — I10 Essential (primary) hypertension: Secondary | ICD-10-CM | POA: Diagnosis not present

## 2022-07-04 DIAGNOSIS — R978 Other abnormal tumor markers: Secondary | ICD-10-CM | POA: Diagnosis not present

## 2022-07-04 DIAGNOSIS — K219 Gastro-esophageal reflux disease without esophagitis: Secondary | ICD-10-CM | POA: Diagnosis not present

## 2022-07-04 DIAGNOSIS — N1832 Chronic kidney disease, stage 3b: Secondary | ICD-10-CM | POA: Diagnosis not present

## 2022-07-04 DIAGNOSIS — R809 Proteinuria, unspecified: Secondary | ICD-10-CM | POA: Diagnosis not present

## 2022-07-04 DIAGNOSIS — I10 Essential (primary) hypertension: Secondary | ICD-10-CM | POA: Diagnosis not present

## 2022-07-04 DIAGNOSIS — E782 Mixed hyperlipidemia: Secondary | ICD-10-CM | POA: Diagnosis not present

## 2022-07-04 DIAGNOSIS — R053 Chronic cough: Secondary | ICD-10-CM | POA: Diagnosis not present

## 2022-07-04 DIAGNOSIS — M79604 Pain in right leg: Secondary | ICD-10-CM | POA: Diagnosis not present

## 2022-07-04 DIAGNOSIS — I700 Atherosclerosis of aorta: Secondary | ICD-10-CM | POA: Diagnosis not present

## 2022-07-04 DIAGNOSIS — G4733 Obstructive sleep apnea (adult) (pediatric): Secondary | ICD-10-CM | POA: Diagnosis not present

## 2022-07-04 DIAGNOSIS — E875 Hyperkalemia: Secondary | ICD-10-CM | POA: Diagnosis not present

## 2022-07-04 DIAGNOSIS — I2510 Atherosclerotic heart disease of native coronary artery without angina pectoris: Secondary | ICD-10-CM | POA: Diagnosis not present

## 2022-07-05 DIAGNOSIS — N053 Unspecified nephritic syndrome with diffuse mesangial proliferative glomerulonephritis: Secondary | ICD-10-CM | POA: Diagnosis not present

## 2022-07-05 DIAGNOSIS — I5032 Chronic diastolic (congestive) heart failure: Secondary | ICD-10-CM | POA: Diagnosis not present

## 2022-07-05 DIAGNOSIS — N1832 Chronic kidney disease, stage 3b: Secondary | ICD-10-CM | POA: Diagnosis not present

## 2022-07-05 DIAGNOSIS — R808 Other proteinuria: Secondary | ICD-10-CM | POA: Diagnosis not present

## 2022-07-16 DIAGNOSIS — G4733 Obstructive sleep apnea (adult) (pediatric): Secondary | ICD-10-CM | POA: Diagnosis not present

## 2022-07-22 DIAGNOSIS — C44329 Squamous cell carcinoma of skin of other parts of face: Secondary | ICD-10-CM | POA: Diagnosis not present

## 2022-08-11 DIAGNOSIS — H25813 Combined forms of age-related cataract, bilateral: Secondary | ICD-10-CM | POA: Diagnosis not present

## 2022-08-11 DIAGNOSIS — H52203 Unspecified astigmatism, bilateral: Secondary | ICD-10-CM | POA: Diagnosis not present

## 2022-08-11 DIAGNOSIS — H524 Presbyopia: Secondary | ICD-10-CM | POA: Diagnosis not present

## 2022-08-11 DIAGNOSIS — H5213 Myopia, bilateral: Secondary | ICD-10-CM | POA: Diagnosis not present

## 2022-08-12 DIAGNOSIS — R053 Chronic cough: Secondary | ICD-10-CM | POA: Diagnosis not present

## 2022-10-03 DIAGNOSIS — R808 Other proteinuria: Secondary | ICD-10-CM | POA: Diagnosis not present

## 2022-10-03 DIAGNOSIS — N1832 Chronic kidney disease, stage 3b: Secondary | ICD-10-CM | POA: Diagnosis not present

## 2022-10-03 DIAGNOSIS — N053 Unspecified nephritic syndrome with diffuse mesangial proliferative glomerulonephritis: Secondary | ICD-10-CM | POA: Diagnosis not present

## 2022-10-03 DIAGNOSIS — I5032 Chronic diastolic (congestive) heart failure: Secondary | ICD-10-CM | POA: Diagnosis not present

## 2022-10-10 DIAGNOSIS — D631 Anemia in chronic kidney disease: Secondary | ICD-10-CM | POA: Diagnosis not present

## 2022-10-10 DIAGNOSIS — N1831 Chronic kidney disease, stage 3a: Secondary | ICD-10-CM | POA: Diagnosis not present

## 2022-10-10 DIAGNOSIS — I5032 Chronic diastolic (congestive) heart failure: Secondary | ICD-10-CM | POA: Diagnosis not present

## 2022-10-10 DIAGNOSIS — N189 Chronic kidney disease, unspecified: Secondary | ICD-10-CM | POA: Diagnosis not present

## 2022-10-10 DIAGNOSIS — R808 Other proteinuria: Secondary | ICD-10-CM | POA: Diagnosis not present

## 2022-10-10 DIAGNOSIS — E559 Vitamin D deficiency, unspecified: Secondary | ICD-10-CM | POA: Diagnosis not present

## 2022-10-10 DIAGNOSIS — I129 Hypertensive chronic kidney disease with stage 1 through stage 4 chronic kidney disease, or unspecified chronic kidney disease: Secondary | ICD-10-CM | POA: Diagnosis not present

## 2022-10-10 DIAGNOSIS — N053 Unspecified nephritic syndrome with diffuse mesangial proliferative glomerulonephritis: Secondary | ICD-10-CM | POA: Diagnosis not present

## 2022-10-29 DIAGNOSIS — I10 Essential (primary) hypertension: Secondary | ICD-10-CM | POA: Diagnosis not present

## 2022-10-29 DIAGNOSIS — R978 Other abnormal tumor markers: Secondary | ICD-10-CM | POA: Diagnosis not present

## 2022-11-03 DIAGNOSIS — Z0000 Encounter for general adult medical examination without abnormal findings: Secondary | ICD-10-CM | POA: Diagnosis not present

## 2022-11-04 DIAGNOSIS — I129 Hypertensive chronic kidney disease with stage 1 through stage 4 chronic kidney disease, or unspecified chronic kidney disease: Secondary | ICD-10-CM | POA: Diagnosis not present

## 2022-11-04 DIAGNOSIS — K219 Gastro-esophageal reflux disease without esophagitis: Secondary | ICD-10-CM | POA: Diagnosis not present

## 2022-11-04 DIAGNOSIS — E875 Hyperkalemia: Secondary | ICD-10-CM | POA: Diagnosis not present

## 2022-11-04 DIAGNOSIS — E782 Mixed hyperlipidemia: Secondary | ICD-10-CM | POA: Diagnosis not present

## 2022-11-04 DIAGNOSIS — I10 Essential (primary) hypertension: Secondary | ICD-10-CM | POA: Diagnosis not present

## 2022-11-04 DIAGNOSIS — R809 Proteinuria, unspecified: Secondary | ICD-10-CM | POA: Diagnosis not present

## 2022-11-04 DIAGNOSIS — R053 Chronic cough: Secondary | ICD-10-CM | POA: Diagnosis not present

## 2022-11-04 DIAGNOSIS — G4733 Obstructive sleep apnea (adult) (pediatric): Secondary | ICD-10-CM | POA: Diagnosis not present

## 2022-11-04 DIAGNOSIS — N1832 Chronic kidney disease, stage 3b: Secondary | ICD-10-CM | POA: Diagnosis not present

## 2022-11-04 DIAGNOSIS — M79604 Pain in right leg: Secondary | ICD-10-CM | POA: Diagnosis not present

## 2022-11-04 DIAGNOSIS — R978 Other abnormal tumor markers: Secondary | ICD-10-CM | POA: Diagnosis not present

## 2022-11-10 DIAGNOSIS — S56911A Strain of unspecified muscles, fascia and tendons at forearm level, right arm, initial encounter: Secondary | ICD-10-CM | POA: Diagnosis not present

## 2022-12-08 DIAGNOSIS — L821 Other seborrheic keratosis: Secondary | ICD-10-CM | POA: Diagnosis not present

## 2022-12-08 DIAGNOSIS — L570 Actinic keratosis: Secondary | ICD-10-CM | POA: Diagnosis not present

## 2022-12-12 DIAGNOSIS — N1831 Chronic kidney disease, stage 3a: Secondary | ICD-10-CM | POA: Diagnosis not present

## 2022-12-12 DIAGNOSIS — N189 Chronic kidney disease, unspecified: Secondary | ICD-10-CM | POA: Diagnosis not present

## 2022-12-12 DIAGNOSIS — R808 Other proteinuria: Secondary | ICD-10-CM | POA: Diagnosis not present

## 2022-12-12 DIAGNOSIS — N053 Unspecified nephritic syndrome with diffuse mesangial proliferative glomerulonephritis: Secondary | ICD-10-CM | POA: Diagnosis not present

## 2022-12-12 DIAGNOSIS — D631 Anemia in chronic kidney disease: Secondary | ICD-10-CM | POA: Diagnosis not present

## 2022-12-17 DIAGNOSIS — S56911D Strain of unspecified muscles, fascia and tendons at forearm level, right arm, subsequent encounter: Secondary | ICD-10-CM | POA: Diagnosis not present

## 2022-12-19 DIAGNOSIS — E875 Hyperkalemia: Secondary | ICD-10-CM | POA: Diagnosis not present

## 2022-12-19 DIAGNOSIS — N1831 Chronic kidney disease, stage 3a: Secondary | ICD-10-CM | POA: Diagnosis not present

## 2022-12-19 DIAGNOSIS — I5032 Chronic diastolic (congestive) heart failure: Secondary | ICD-10-CM | POA: Diagnosis not present

## 2022-12-19 DIAGNOSIS — R808 Other proteinuria: Secondary | ICD-10-CM | POA: Diagnosis not present

## 2023-02-04 DIAGNOSIS — G4733 Obstructive sleep apnea (adult) (pediatric): Secondary | ICD-10-CM | POA: Diagnosis not present

## 2023-03-04 DIAGNOSIS — D485 Neoplasm of uncertain behavior of skin: Secondary | ICD-10-CM | POA: Diagnosis not present

## 2023-03-04 DIAGNOSIS — L820 Inflamed seborrheic keratosis: Secondary | ICD-10-CM | POA: Diagnosis not present

## 2023-03-04 DIAGNOSIS — L570 Actinic keratosis: Secondary | ICD-10-CM | POA: Diagnosis not present

## 2023-03-04 DIAGNOSIS — L821 Other seborrheic keratosis: Secondary | ICD-10-CM | POA: Diagnosis not present

## 2023-03-04 DIAGNOSIS — D0439 Carcinoma in situ of skin of other parts of face: Secondary | ICD-10-CM | POA: Diagnosis not present

## 2023-03-30 DIAGNOSIS — I10 Essential (primary) hypertension: Secondary | ICD-10-CM | POA: Diagnosis not present

## 2023-03-30 DIAGNOSIS — R808 Other proteinuria: Secondary | ICD-10-CM | POA: Diagnosis not present

## 2023-03-30 DIAGNOSIS — E119 Type 2 diabetes mellitus without complications: Secondary | ICD-10-CM | POA: Diagnosis not present

## 2023-03-30 DIAGNOSIS — N1831 Chronic kidney disease, stage 3a: Secondary | ICD-10-CM | POA: Diagnosis not present

## 2023-03-30 DIAGNOSIS — E211 Secondary hyperparathyroidism, not elsewhere classified: Secondary | ICD-10-CM | POA: Diagnosis not present

## 2023-04-14 DIAGNOSIS — D0439 Carcinoma in situ of skin of other parts of face: Secondary | ICD-10-CM | POA: Diagnosis not present

## 2023-04-24 DIAGNOSIS — D485 Neoplasm of uncertain behavior of skin: Secondary | ICD-10-CM | POA: Diagnosis not present

## 2023-04-24 DIAGNOSIS — D0439 Carcinoma in situ of skin of other parts of face: Secondary | ICD-10-CM | POA: Diagnosis not present

## 2023-04-24 DIAGNOSIS — L820 Inflamed seborrheic keratosis: Secondary | ICD-10-CM | POA: Diagnosis not present

## 2023-04-30 DIAGNOSIS — R809 Proteinuria, unspecified: Secondary | ICD-10-CM | POA: Diagnosis not present

## 2023-04-30 DIAGNOSIS — I10 Essential (primary) hypertension: Secondary | ICD-10-CM | POA: Diagnosis not present

## 2023-04-30 DIAGNOSIS — E559 Vitamin D deficiency, unspecified: Secondary | ICD-10-CM | POA: Diagnosis not present

## 2023-05-06 DIAGNOSIS — Z23 Encounter for immunization: Secondary | ICD-10-CM | POA: Diagnosis not present

## 2023-05-06 DIAGNOSIS — N1832 Chronic kidney disease, stage 3b: Secondary | ICD-10-CM | POA: Diagnosis not present

## 2023-05-06 DIAGNOSIS — G4733 Obstructive sleep apnea (adult) (pediatric): Secondary | ICD-10-CM | POA: Diagnosis not present

## 2023-05-06 DIAGNOSIS — I10 Essential (primary) hypertension: Secondary | ICD-10-CM | POA: Diagnosis not present

## 2023-05-06 DIAGNOSIS — E782 Mixed hyperlipidemia: Secondary | ICD-10-CM | POA: Diagnosis not present

## 2023-05-06 DIAGNOSIS — R053 Chronic cough: Secondary | ICD-10-CM | POA: Diagnosis not present

## 2023-05-06 DIAGNOSIS — E875 Hyperkalemia: Secondary | ICD-10-CM | POA: Diagnosis not present

## 2023-05-06 DIAGNOSIS — I129 Hypertensive chronic kidney disease with stage 1 through stage 4 chronic kidney disease, or unspecified chronic kidney disease: Secondary | ICD-10-CM | POA: Diagnosis not present

## 2023-05-06 DIAGNOSIS — R809 Proteinuria, unspecified: Secondary | ICD-10-CM | POA: Diagnosis not present

## 2023-05-06 DIAGNOSIS — K219 Gastro-esophageal reflux disease without esophagitis: Secondary | ICD-10-CM | POA: Diagnosis not present

## 2023-05-06 DIAGNOSIS — R978 Other abnormal tumor markers: Secondary | ICD-10-CM | POA: Diagnosis not present

## 2023-05-06 DIAGNOSIS — M79604 Pain in right leg: Secondary | ICD-10-CM | POA: Diagnosis not present

## 2023-06-03 DIAGNOSIS — R809 Proteinuria, unspecified: Secondary | ICD-10-CM | POA: Diagnosis not present

## 2023-06-03 DIAGNOSIS — N1832 Chronic kidney disease, stage 3b: Secondary | ICD-10-CM | POA: Diagnosis not present

## 2023-06-03 DIAGNOSIS — I10 Essential (primary) hypertension: Secondary | ICD-10-CM | POA: Diagnosis not present

## 2023-07-06 DIAGNOSIS — R808 Other proteinuria: Secondary | ICD-10-CM | POA: Diagnosis not present

## 2023-07-06 DIAGNOSIS — I129 Hypertensive chronic kidney disease with stage 1 through stage 4 chronic kidney disease, or unspecified chronic kidney disease: Secondary | ICD-10-CM | POA: Diagnosis not present

## 2023-07-06 DIAGNOSIS — N1831 Chronic kidney disease, stage 3a: Secondary | ICD-10-CM | POA: Diagnosis not present

## 2023-07-06 DIAGNOSIS — I5032 Chronic diastolic (congestive) heart failure: Secondary | ICD-10-CM | POA: Diagnosis not present

## 2023-07-06 DIAGNOSIS — N189 Chronic kidney disease, unspecified: Secondary | ICD-10-CM | POA: Diagnosis not present

## 2023-07-13 DIAGNOSIS — I5032 Chronic diastolic (congestive) heart failure: Secondary | ICD-10-CM | POA: Diagnosis not present

## 2023-07-13 DIAGNOSIS — R808 Other proteinuria: Secondary | ICD-10-CM | POA: Diagnosis not present

## 2023-07-13 DIAGNOSIS — N1831 Chronic kidney disease, stage 3a: Secondary | ICD-10-CM | POA: Diagnosis not present

## 2023-07-13 DIAGNOSIS — N053 Unspecified nephritic syndrome with diffuse mesangial proliferative glomerulonephritis: Secondary | ICD-10-CM | POA: Diagnosis not present

## 2023-07-23 ENCOUNTER — Other Ambulatory Visit (HOSPITAL_COMMUNITY): Payer: Self-pay | Admitting: Nurse Practitioner

## 2023-07-23 DIAGNOSIS — R808 Other proteinuria: Secondary | ICD-10-CM

## 2023-07-23 DIAGNOSIS — I5032 Chronic diastolic (congestive) heart failure: Secondary | ICD-10-CM

## 2023-07-23 DIAGNOSIS — I129 Hypertensive chronic kidney disease with stage 1 through stage 4 chronic kidney disease, or unspecified chronic kidney disease: Secondary | ICD-10-CM

## 2023-07-23 DIAGNOSIS — E875 Hyperkalemia: Secondary | ICD-10-CM

## 2023-07-23 DIAGNOSIS — N053 Unspecified nephritic syndrome with diffuse mesangial proliferative glomerulonephritis: Secondary | ICD-10-CM

## 2023-07-23 DIAGNOSIS — N1831 Chronic kidney disease, stage 3a: Secondary | ICD-10-CM

## 2023-07-23 DIAGNOSIS — E559 Vitamin D deficiency, unspecified: Secondary | ICD-10-CM

## 2023-07-24 DIAGNOSIS — D472 Monoclonal gammopathy: Secondary | ICD-10-CM | POA: Diagnosis not present

## 2023-07-24 DIAGNOSIS — D631 Anemia in chronic kidney disease: Secondary | ICD-10-CM | POA: Diagnosis not present

## 2023-07-24 DIAGNOSIS — R809 Proteinuria, unspecified: Secondary | ICD-10-CM | POA: Diagnosis not present

## 2023-07-24 DIAGNOSIS — E211 Secondary hyperparathyroidism, not elsewhere classified: Secondary | ICD-10-CM | POA: Diagnosis not present

## 2023-07-24 DIAGNOSIS — N189 Chronic kidney disease, unspecified: Secondary | ICD-10-CM | POA: Diagnosis not present

## 2023-07-27 DIAGNOSIS — L821 Other seborrheic keratosis: Secondary | ICD-10-CM | POA: Diagnosis not present

## 2023-07-27 DIAGNOSIS — L565 Disseminated superficial actinic porokeratosis (DSAP): Secondary | ICD-10-CM | POA: Diagnosis not present

## 2023-07-27 DIAGNOSIS — L814 Other melanin hyperpigmentation: Secondary | ICD-10-CM | POA: Diagnosis not present

## 2023-07-27 DIAGNOSIS — Z86018 Personal history of other benign neoplasm: Secondary | ICD-10-CM | POA: Diagnosis not present

## 2023-07-27 DIAGNOSIS — L578 Other skin changes due to chronic exposure to nonionizing radiation: Secondary | ICD-10-CM | POA: Diagnosis not present

## 2023-07-27 DIAGNOSIS — D225 Melanocytic nevi of trunk: Secondary | ICD-10-CM | POA: Diagnosis not present

## 2023-07-27 DIAGNOSIS — L570 Actinic keratosis: Secondary | ICD-10-CM | POA: Diagnosis not present

## 2023-07-30 ENCOUNTER — Telehealth (INDEPENDENT_AMBULATORY_CARE_PROVIDER_SITE_OTHER): Payer: Self-pay | Admitting: Otolaryngology

## 2023-07-30 NOTE — Telephone Encounter (Signed)
 Reminder Call:  Date: 07/31/2023 Status: Sch  Time: 9:20 AM [address] [place], [place] [address] Confirmed

## 2023-07-31 ENCOUNTER — Ambulatory Visit (INDEPENDENT_AMBULATORY_CARE_PROVIDER_SITE_OTHER): Payer: Medicare Other | Admitting: Audiology

## 2023-07-31 ENCOUNTER — Encounter (INDEPENDENT_AMBULATORY_CARE_PROVIDER_SITE_OTHER): Payer: Self-pay

## 2023-07-31 ENCOUNTER — Ambulatory Visit (INDEPENDENT_AMBULATORY_CARE_PROVIDER_SITE_OTHER): Payer: Medicare Other | Admitting: Otolaryngology

## 2023-07-31 VITALS — BP 137/91 | HR 70 | Ht 67.0 in | Wt 165.0 lb

## 2023-07-31 DIAGNOSIS — H903 Sensorineural hearing loss, bilateral: Secondary | ICD-10-CM | POA: Diagnosis not present

## 2023-07-31 NOTE — Progress Notes (Signed)
  [address] [place], [place] [address] [phone]  Audiological Evaluation    Name: Luke Hoffman     DOB:   09/28/1955      MRN:   [id number]                                                                                     Service Date: 07/31/2023     Accompanied by: unaccompanied    Patient comes today after Dr. Karis, ENT sent a referral for a hearing evaluation due to concerns with hearing loss.   Symptoms Yes Details  Hearing loss  [x]  Difficulty hearing when in background noise.   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 showed bilateral high frequency hearing loss, slightly worse in the left ear  Tinnitus  []    Ear pain/ Ear infections  []    Balance problems  []    Noise exposure  [x]  Occasional shooting and music  Previous ear surgeries  []    Family history  [x]  Mother developed hearing loss with age  Amplification  []    Other  []      Otoscopy: Right ear: Clear external ear canals and notable landmarks visualized on the tympanic membrane. Left ear:  Clear external ear canals and notable landmarks visualized on the tympanic membrane.  Tympanometry: Right ear: Type A- Normal external ear canal volume with normal middle ear pressure and tympanic membrane compliance Left ear: Type A- Normal external ear canal volume with normal middle ear pressure and tympanic membrane compliance    Pure tone Audiometry: Right ear- Normal sloping to moderate sensorineural hearing loss from 250 Hz - 8000 Hz. Left ear-  Normal sloping to moderately severe sensorineural hearing loss from 250 Hz - 8000 Hz.   Results are slightly worse in the left ear.  The hearing test results were completed under headphones and results are deemed to be of good reliability. Test technique:  conventional     Speech Audiometry: Right ear- Speech Reception Threshold (SRT) was obtained at 10 dBHL Left ear-Speech Reception Threshold (SRT) was obtained at 15 dBHL   Word Recognition  Score Tested using NU-6 (MLV) Right ear: 96% was obtained at a presentation level of 65 dBHL with contralateral masking which is deemed as  excellent Left ear: 96% was obtained at a presentation level of 70 dBHL with contralateral masking which is deemed as  excellent     Recommendations: Follow up with ENT as scheduled for today. Consider a communication needs assessment after medical clearance for hearing aids is obtained.   Luke Hoffman Luke Hoffman, AUD

## 2023-07-31 NOTE — Progress Notes (Signed)
 Patient ID: Luke Hoffman, male   DOB: 02-28-1956, 68 y.o.   MRN: [id number]  Follow-up: Hearing loss  HPI: The patient is a 68 year old male who returns today for his follow-up evaluation.  He was last seen 1 year ago.  At that time, he was noted to have bilateral symmetric high-frequency sensorineural hearing loss, likely secondary to routine presbycusis.  The hearing amplification options were discussed.  The patient returns today complaining of progressive worsening of his hearing.  He is having difficulty hearing in noisy environments.  He denies any otalgia, otorrhea, or vertigo.  He has no recent otitis media or otitis externa.  Exam: General: Communicates without difficulty, well nourished, no acute distress. Head: Normocephalic, no evidence injury, no tenderness, facial buttresses intact without stepoff. Face/sinus: No tenderness to palpation and percussion. Facial movement is normal and symmetric. Eyes: PERRL, EOMI. No scleral icterus, conjunctivae clear. Neuro: CN II exam reveals vision grossly intact.  No nystagmus at any point of gaze. Ears: Auricles well formed without lesions.  Ear canals are intact without mass or lesion.  No erythema or edema is appreciated.  The TMs are intact without fluid. Nose: External evaluation reveals normal support and skin without lesions.  Dorsum is intact.  Anterior rhinoscopy reveals normal mucosa over anterior aspect of inferior turbinates and intact septum.  No purulence noted. Oral:  Oral cavity and oropharynx are intact, symmetric, without erythema or edema.  Mucosa is moist without lesions. Neck: Full range of motion without pain.  There is no significant lymphadenopathy.  No masses palpable.  Thyroid  bed within normal limits to palpation.  Parotid glands and submandibular glands equal bilaterally without mass.  Trachea is midline. Neuro:  CN 2-12 grossly intact.   His hearing test shows bilateral high-frequency sensorineural hearing  loss.  Assessment: 1.  Stable bilateral high-frequency sensorineural hearing loss, likely secondary to routine presbycusis. 2.  His ear canals, tympanic membranes, and middle ear spaces are all normal.  Plan: 1.  The physical exam findings and the hearing test results are reviewed with the patient. 2.  The patient is a candidate for hearing amplification.  The hearing aid options are discussed. 3.  The patient will return for reevaluation in 1 year, sooner if needed.

## 2023-08-10 DIAGNOSIS — H2513 Age-related nuclear cataract, bilateral: Secondary | ICD-10-CM | POA: Diagnosis not present

## 2023-08-10 DIAGNOSIS — H40013 Open angle with borderline findings, low risk, bilateral: Secondary | ICD-10-CM | POA: Diagnosis not present

## 2023-08-19 ENCOUNTER — Ambulatory Visit (HOSPITAL_COMMUNITY): Payer: Medicare Other

## 2023-08-21 ENCOUNTER — Ambulatory Visit (HOSPITAL_COMMUNITY): Payer: Medicare Other

## 2023-08-24 DIAGNOSIS — D631 Anemia in chronic kidney disease: Secondary | ICD-10-CM | POA: Diagnosis not present

## 2023-08-24 DIAGNOSIS — N053 Unspecified nephritic syndrome with diffuse mesangial proliferative glomerulonephritis: Secondary | ICD-10-CM | POA: Diagnosis not present

## 2023-08-24 DIAGNOSIS — N1831 Chronic kidney disease, stage 3a: Secondary | ICD-10-CM | POA: Diagnosis not present

## 2023-08-24 DIAGNOSIS — I5032 Chronic diastolic (congestive) heart failure: Secondary | ICD-10-CM | POA: Diagnosis not present

## 2023-08-24 DIAGNOSIS — R808 Other proteinuria: Secondary | ICD-10-CM | POA: Diagnosis not present

## 2023-08-25 ENCOUNTER — Other Ambulatory Visit: Payer: Self-pay | Admitting: Radiology

## 2023-08-25 DIAGNOSIS — R809 Proteinuria, unspecified: Secondary | ICD-10-CM

## 2023-08-25 NOTE — H&P (Signed)
 Chief Complaint: Patient was seen in consultation today for non-focal renal biopsy  Referring Physician(s): Hoffman,Luke  Supervising Physician: Luke Hoffman  Patient Status: [facility] - Out-pt  History of Present Illness: Luke Hoffman is a 68 y.o. 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 He is familiar to IR from a prior non-focal renal biopsy 01/10/22. He has been on CellCept and steroid therapy since July 2023. His kidney function has worsened and his proteinuria has increased. His nephrology team is requesting a repeat non-focal renal biopsy for further work up.   Past Medical History:  Diagnosis Date   Bilateral inguinal hernia    Mesangial proliferative glomerulonephritis 03/03/2022   Nephrotic range proteinuria 03/03/2022   Sleep apnea 04/2020   uses CPAP    Past Surgical History:  Procedure Laterality Date   COLONOSCOPY  2006   EYE SURGERY Bilateral    lasik   INGUINAL HERNIA REPAIR Bilateral 06/12/2020   Procedure: LAPAROSCOPIC BILATERAL INGUINAL HERNIA REPAIR WITH MESH;  Surgeon: Luke Lenis, MD;  Location: [facility];  Service: General;  Laterality: Bilateral;   INSERTION OF MESH Bilateral 06/12/2020   Procedure: INSERTION OF MESH;  Surgeon: Luke Lenis, MD;  Location: [facility];  Service: General;  Laterality: Bilateral;   NECK SURGERY  1998 and 2000   neck/spinal fusion w plates and screws   SKIN GRAFT  1975   inner thigh to foot    Allergies: Patient has no known allergies.  Medications: Prior to Admission medications   Medication Sig Start Date End Date Taking? Authorizing Provider  dexlansoprazole (DEXILANT) 60 MG capsule Take 1 capsule by mouth daily. 03/25/22   [provider]  famotidine  (PEPCID ) 20 MG tablet One after supper 04/15/22   Luke Ozell NOVAK, MD  FARXIGA 5 MG TABS tablet SMARTSIG:5  Milligram(s) By Mouth Every Morning 03/19/22   [provider]  losartan  (COZAAR ) 25 MG tablet Take 12.5 mg by mouth daily.    [provider]  mycophenolate (CELLCEPT) 500 MG tablet Take 500 mg by mouth 2 (two) times daily. Patient not taking: Reported on 07/31/2023 02/12/22   [provider]  torsemide (DEMADEX) 100 MG tablet Take 100 mg by mouth daily.    [provider]     Family History  Problem Relation Age of Onset   Colon cancer Neg Hx     Social History   Socioeconomic History   Marital status: Divorced    Spouse name: Not on file   Number of children: Not on file   Years of education: Not on file   Highest education level: Not on file  Occupational History   Not on file  Tobacco Use   Smoking status: Never   Smokeless tobacco: Never  Substance and Sexual Activity   Alcohol use: Yes    Alcohol/week: 1.0 standard drink of alcohol    Types: 1 Standard drinks or equivalent per week    Comment: rarely   Drug use: Never   Sexual activity: Not on file  Other Topics Concern   Not on file  Social History Narrative   Not on file   Social Drivers of Health   Financial Resource Strain: Not on file  Food Insecurity: Not on file  Transportation Needs: Not on file  Physical Activity: Not on file  Stress: Not on file  Social Connections: Not on file    Review of Systems:  A 12 point ROS discussed and pertinent positives are indicated in the HPI above.  All other systems are negative.  Review of Systems  All other systems reviewed and are negative.   Vital Signs: BP 123/80   Pulse 61   Temp 98.2 F (36.8 C) (Oral)   Resp 18   Ht 5' 6 (1.676 m)   Wt 165 lb (74.8 kg)   SpO2 99%   BMI 26.63 kg/m   Physical Exam Constitutional:      General: He is not in acute distress.    Appearance: He is not ill-appearing.  HENT:     Mouth/Throat:     Mouth: Mucous membranes are moist.     Pharynx: Oropharynx is clear.  Cardiovascular:      Rate and Rhythm: Normal rate.     Pulses: Normal pulses.  Pulmonary:     Effort: Pulmonary effort is normal.  Abdominal:     Tenderness: There is no abdominal tenderness.  Skin:    General: Skin is warm and dry.  Neurological:     Mental Status: He is alert and oriented to person, place, and time.  Psychiatric:        Mood and Affect: Mood normal.        Behavior: Behavior normal.        Thought Content: Thought content normal.        Judgment: Judgment normal.     Imaging: No results found.  Labs:  CBC: Recent Labs    08/26/23 0655  WBC 7.0  HGB 14.1  HCT 41.5  PLT 253    COAGS: Recent Labs    08/26/23 0655  INR 1.0    BMP: No results for input(s): NA, K, CL, CO2, GLUCOSE, BUN, CALCIUM, CREATININE, GFRNONAA, GFRAA in the last 8760 hours.  Invalid input(s): CMP  LIVER FUNCTION TESTS: No results for input(s): BILITOT, AST, ALT, ALKPHOS, PROT, ALBUMIN in the last 8760 hours.  TUMOR MARKERS: No results for input(s): AFPTM, CEA, CA199, CHROMGRNA in the last 8760 hours.  Assessment and Plan:  Mesangial proliferative glomerulonephritis with worsening kidney function and proteinuria: Luke Hoffman. [name], 68 year old male, presents today to the [facility] Interventional Radiology department for an image-guided non-focal renal biopsy.   Risks and benefits of this procedure were discussed with the patient and/or patient's family including, but not limited to bleeding, infection, damage to adjacent structures or low yield requiring additional tests.  All of the questions were answered and there is agreement to proceed. He has been NPO. He is a full code. He does not take any blood-thinning medications.   Consent signed and in chart.  Thank you for this interesting consult.  I greatly enjoyed meeting Luke Hoffman and look forward to participating in their care.  A copy of this report was sent to the requesting  provider on this date.  Electronically Signed: Warren Hoffman, AGACNP-BC 08/26/2023, 7:09 AM   I spent a total of  30 Minutes   in face to face in clinical consultation, greater than 50% of which was counseling/coordinating care for non-focal renal biopsy.

## 2023-08-26 ENCOUNTER — Other Ambulatory Visit: Payer: Self-pay

## 2023-08-26 ENCOUNTER — Ambulatory Visit (HOSPITAL_COMMUNITY)
Admission: RE | Admit: 2023-08-26 | Discharge: 2023-08-26 | Disposition: A | Discharge status: Home / Self Care | Payer: Medicare Other | Source: Ambulatory Visit | Attending: Nurse Practitioner | Admitting: Nurse Practitioner

## 2023-08-26 DIAGNOSIS — I5032 Chronic diastolic (congestive) heart failure: Secondary | ICD-10-CM

## 2023-08-26 DIAGNOSIS — R808 Other proteinuria: Secondary | ICD-10-CM | POA: Diagnosis not present

## 2023-08-26 DIAGNOSIS — I130 Hypertensive heart and chronic kidney disease with heart failure and stage 1 through stage 4 chronic kidney disease, or unspecified chronic kidney disease: Secondary | ICD-10-CM | POA: Insufficient documentation

## 2023-08-26 DIAGNOSIS — E875 Hyperkalemia: Secondary | ICD-10-CM | POA: Diagnosis not present

## 2023-08-26 DIAGNOSIS — N189 Chronic kidney disease, unspecified: Secondary | ICD-10-CM | POA: Diagnosis not present

## 2023-08-26 DIAGNOSIS — R809 Proteinuria, unspecified: Secondary | ICD-10-CM

## 2023-08-26 DIAGNOSIS — N1831 Chronic kidney disease, stage 3a: Secondary | ICD-10-CM | POA: Diagnosis not present

## 2023-08-26 DIAGNOSIS — I129 Hypertensive chronic kidney disease with stage 1 through stage 4 chronic kidney disease, or unspecified chronic kidney disease: Secondary | ICD-10-CM

## 2023-08-26 DIAGNOSIS — N053 Unspecified nephritic syndrome with diffuse mesangial proliferative glomerulonephritis: Secondary | ICD-10-CM | POA: Diagnosis not present

## 2023-08-26 DIAGNOSIS — E559 Vitamin D deficiency, unspecified: Secondary | ICD-10-CM

## 2023-08-26 LAB — CBC
HCT: 41.5 % (ref 39.0–52.0)
Hemoglobin: 14.1 g/dL (ref 13.0–17.0)
MCH: 30.8 pg (ref 26.0–34.0)
MCHC: 34 g/dL (ref 30.0–36.0)
MCV: 90.6 fL (ref 80.0–100.0)
Platelets: 253 10*3/uL (ref 150–400)
RBC: 4.58 MIL/uL (ref 4.22–5.81)
RDW: 13 % (ref 11.5–15.5)
WBC: 7 10*3/uL (ref 4.0–10.5)
nRBC: 0 % (ref 0.0–0.2)

## 2023-08-26 LAB — PROTIME-INR
INR: 1 (ref 0.8–1.2)
Prothrombin Time: 13.2 s (ref 11.4–15.2)

## 2023-08-26 MED ORDER — LIDOCAINE HCL (PF) 1 % IJ SOLN
10.0000 mL | Freq: Once | INTRAMUSCULAR | Status: AC
  Administered 2023-08-26: 10 mL via INTRADERMAL

## 2023-08-26 MED ORDER — FENTANYL CITRATE (PF) 100 MCG/2ML IJ SOLN
INTRAMUSCULAR | Status: AC | PRN
  Administered 2023-08-26 (×2): 25 ug via INTRAVENOUS

## 2023-08-26 MED ORDER — MIDAZOLAM HCL 2 MG/2ML IJ SOLN
INTRAMUSCULAR | Status: AC | PRN
  Administered 2023-08-26: .5 mg via INTRAVENOUS

## 2023-08-26 MED ORDER — GELATIN ABSORBABLE 12-7 MM EX MISC
1.0000 | Freq: Once | CUTANEOUS | Status: AC
  Administered 2023-08-26: 1 via TOPICAL

## 2023-08-26 MED ORDER — SODIUM CHLORIDE 0.9 % IV SOLN: INTRAVENOUS | Status: DC

## 2023-08-26 MED ORDER — FENTANYL CITRATE (PF) 100 MCG/2ML IJ SOLN
INTRAMUSCULAR | Status: AC
  Filled 2023-08-26: qty 2

## 2023-08-26 NOTE — Discharge Instructions (Signed)
 Drink plenty of fluids over the next 2-3 days.

## 2023-08-26 NOTE — Procedures (Signed)
 Interventional Radiology Procedure Note  Procedure: Ultrasound guided left renal biopsy   Findings: Please refer to procedural dictation for full description. 18 ga core x 2. Gelfoam slurry needle track embolization.  Complications: None immediate  Estimated Blood Loss: < 5 ml  Recommendations: Strict 3 hour bedrest. Follow Pathology results.   Ester Sides, MD

## 2023-08-26 NOTE — Progress Notes (Signed)
 Drawer failed during med pull of versed ; wasted 1.5 mg; created discrepancy in pyxis

## 2023-08-28 DIAGNOSIS — N053 Unspecified nephritic syndrome with diffuse mesangial proliferative glomerulonephritis: Secondary | ICD-10-CM | POA: Diagnosis not present

## 2023-08-28 DIAGNOSIS — I129 Hypertensive chronic kidney disease with stage 1 through stage 4 chronic kidney disease, or unspecified chronic kidney disease: Secondary | ICD-10-CM | POA: Diagnosis not present

## 2023-08-28 DIAGNOSIS — N1831 Chronic kidney disease, stage 3a: Secondary | ICD-10-CM | POA: Diagnosis not present

## 2023-08-28 DIAGNOSIS — I5032 Chronic diastolic (congestive) heart failure: Secondary | ICD-10-CM | POA: Diagnosis not present

## 2023-09-01 ENCOUNTER — Encounter (HOSPITAL_COMMUNITY): Payer: Self-pay

## 2023-09-07 LAB — SURGICAL PATHOLOGY

## 2023-11-02 DIAGNOSIS — I10 Essential (primary) hypertension: Secondary | ICD-10-CM | POA: Diagnosis not present

## 2023-11-02 DIAGNOSIS — E559 Vitamin D deficiency, unspecified: Secondary | ICD-10-CM | POA: Diagnosis not present

## 2023-11-02 DIAGNOSIS — R809 Proteinuria, unspecified: Secondary | ICD-10-CM | POA: Diagnosis not present

## 2023-11-10 DIAGNOSIS — N1832 Chronic kidney disease, stage 3b: Secondary | ICD-10-CM | POA: Diagnosis not present

## 2023-11-10 DIAGNOSIS — I10 Essential (primary) hypertension: Secondary | ICD-10-CM | POA: Diagnosis not present

## 2023-11-10 DIAGNOSIS — K219 Gastro-esophageal reflux disease without esophagitis: Secondary | ICD-10-CM | POA: Diagnosis not present

## 2023-11-10 DIAGNOSIS — M79604 Pain in right leg: Secondary | ICD-10-CM | POA: Diagnosis not present

## 2023-11-10 DIAGNOSIS — R053 Chronic cough: Secondary | ICD-10-CM | POA: Diagnosis not present

## 2023-11-10 DIAGNOSIS — R809 Proteinuria, unspecified: Secondary | ICD-10-CM | POA: Diagnosis not present

## 2023-11-10 DIAGNOSIS — R978 Other abnormal tumor markers: Secondary | ICD-10-CM | POA: Diagnosis not present

## 2023-11-10 DIAGNOSIS — E782 Mixed hyperlipidemia: Secondary | ICD-10-CM | POA: Diagnosis not present

## 2023-11-10 DIAGNOSIS — I129 Hypertensive chronic kidney disease with stage 1 through stage 4 chronic kidney disease, or unspecified chronic kidney disease: Secondary | ICD-10-CM | POA: Diagnosis not present

## 2023-11-10 DIAGNOSIS — E875 Hyperkalemia: Secondary | ICD-10-CM | POA: Diagnosis not present

## 2023-11-10 DIAGNOSIS — Z0001 Encounter for general adult medical examination with abnormal findings: Secondary | ICD-10-CM | POA: Diagnosis not present

## 2023-12-11 DIAGNOSIS — E211 Secondary hyperparathyroidism, not elsewhere classified: Secondary | ICD-10-CM | POA: Diagnosis not present

## 2023-12-11 DIAGNOSIS — R809 Proteinuria, unspecified: Secondary | ICD-10-CM | POA: Diagnosis not present

## 2023-12-11 DIAGNOSIS — N189 Chronic kidney disease, unspecified: Secondary | ICD-10-CM | POA: Diagnosis not present

## 2023-12-11 DIAGNOSIS — D631 Anemia in chronic kidney disease: Secondary | ICD-10-CM | POA: Diagnosis not present

## 2023-12-17 IMAGING — CT CT ABD-PELV W/O CM
2 of 4 series · 16 of 46 positions shown, 18 images · non-contrast
Comparison: None Available.

CLINICAL DATA: Abdominal swelling, acute kidney failure



[Series 2: axial st · axial · 0.90mm/px · z∈[+954,+1339]mm · 13 of 89 slices shown, 15 images]
[im 6/89  soft-tissue]
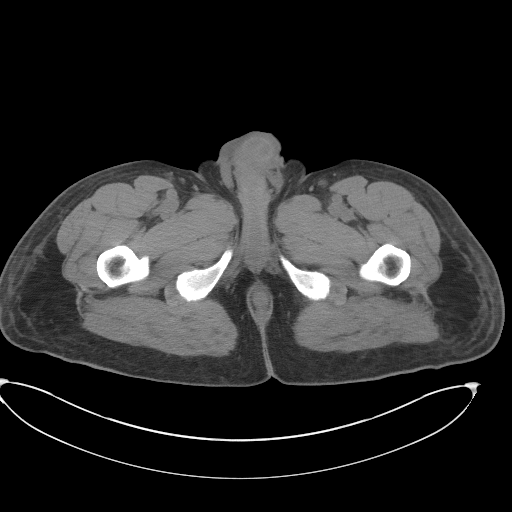
[im 6/89  bone]
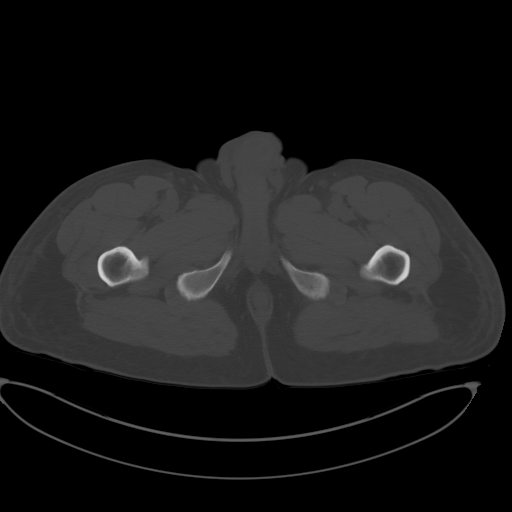
[im 11/89  soft-tissue]
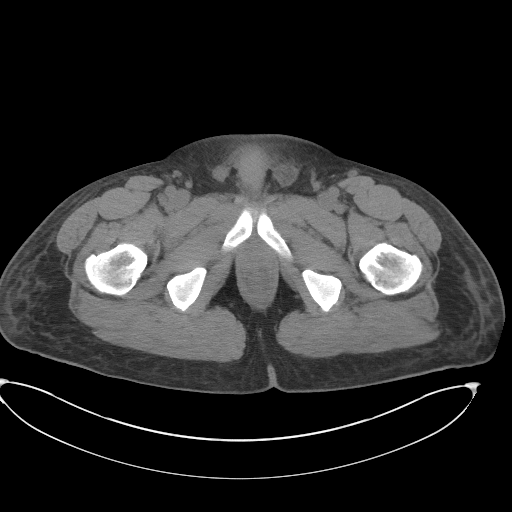
[im 21/89  soft-tissue]
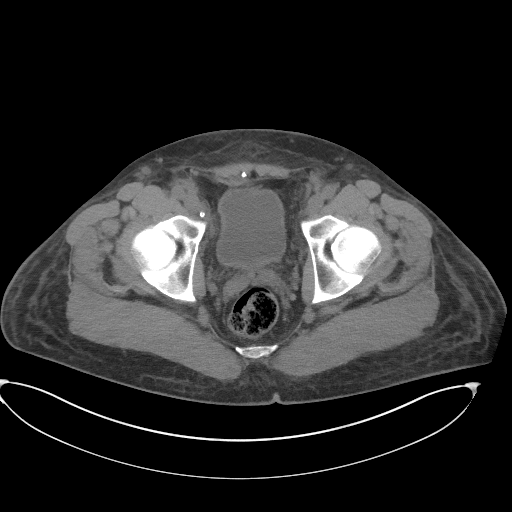
[im 26/89  soft-tissue]
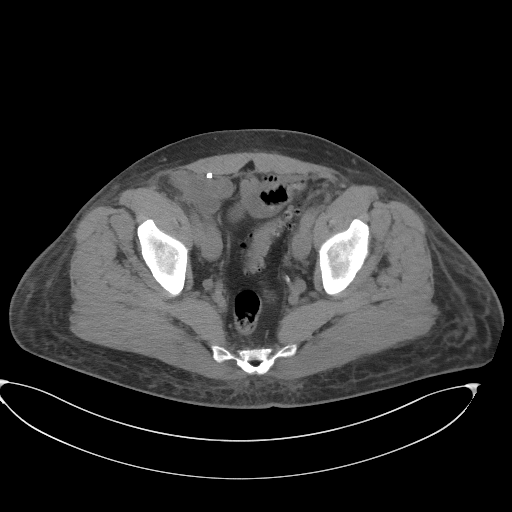
[im 32/89  soft-tissue]
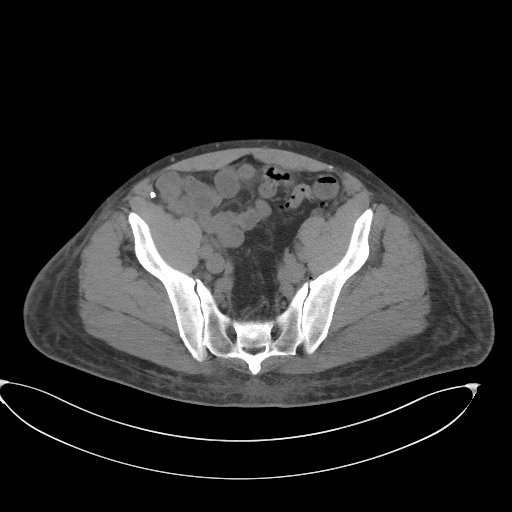
[im 37/89  soft-tissue]
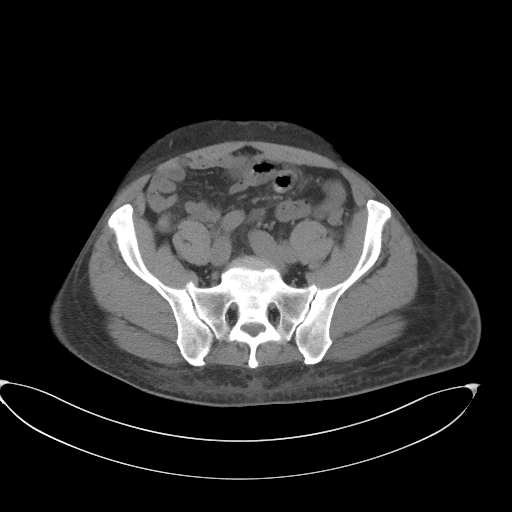
[im 47/89  soft-tissue]
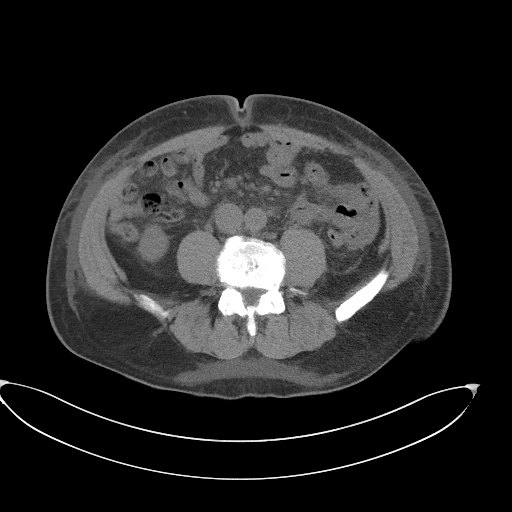
[im 52/89  soft-tissue]
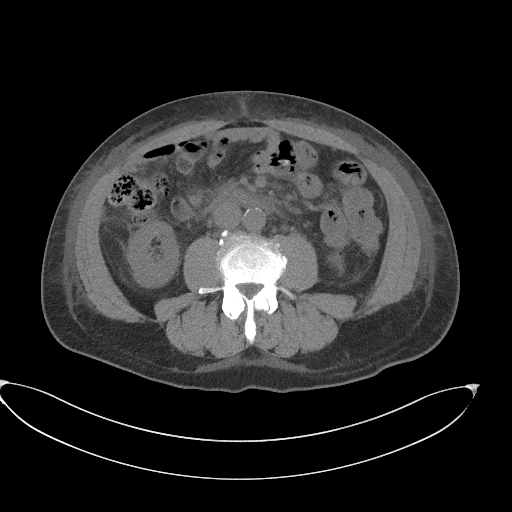
[im 57/89  soft-tissue]
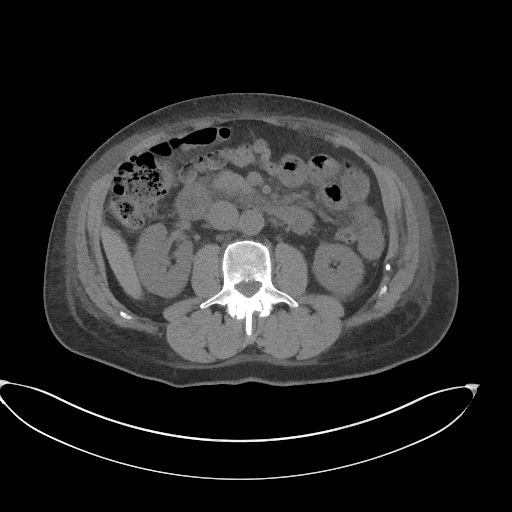
[im 57/89  bone]
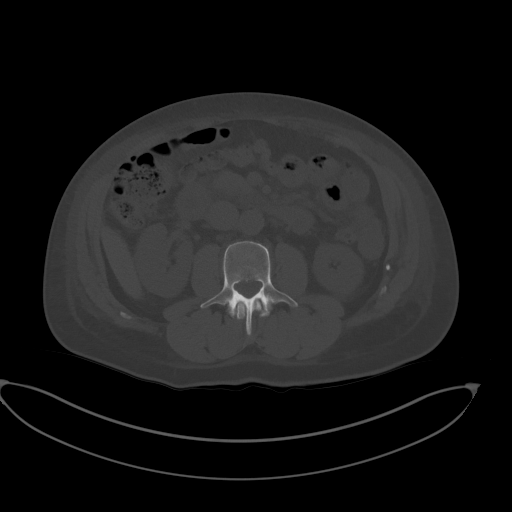
[im 63/89  soft-tissue]
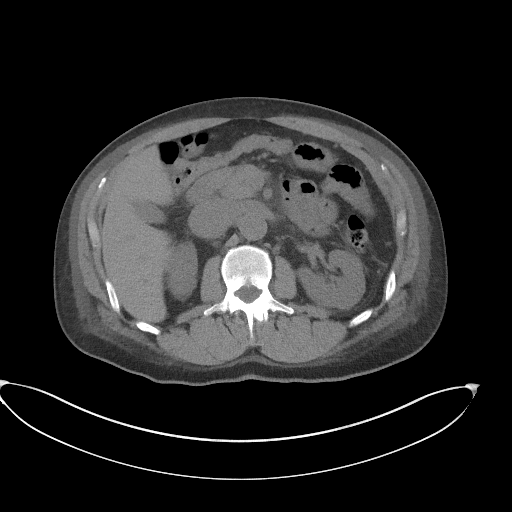
[im 68/89  soft-tissue]
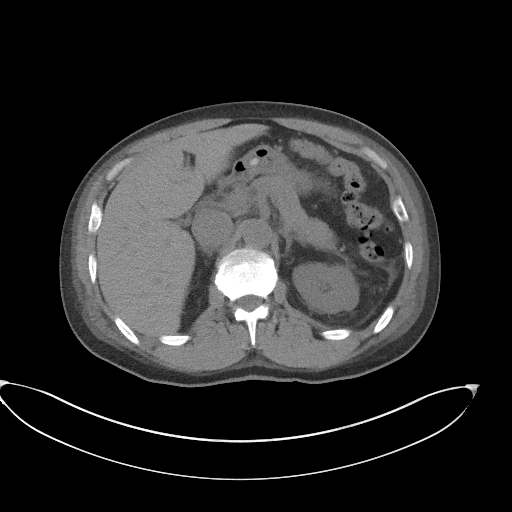
[im 78/89  soft-tissue]
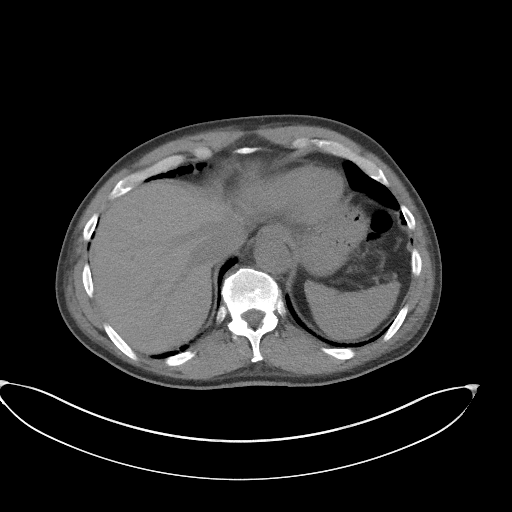
[im 83/89  soft-tissue]
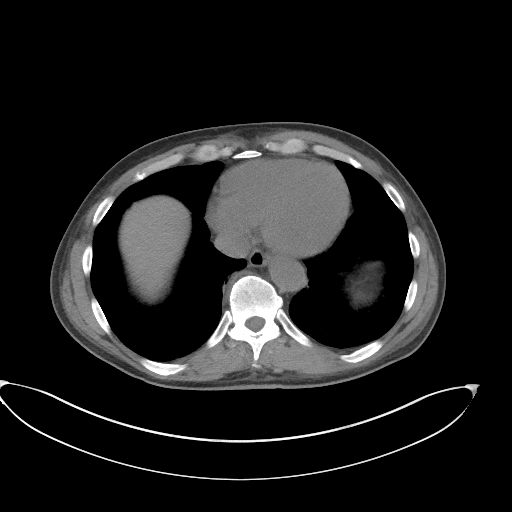

[Series 5: coronal st · coronal · 0.89mm/px · 3 of 141 slices shown]
[im 47/141  soft-tissue]
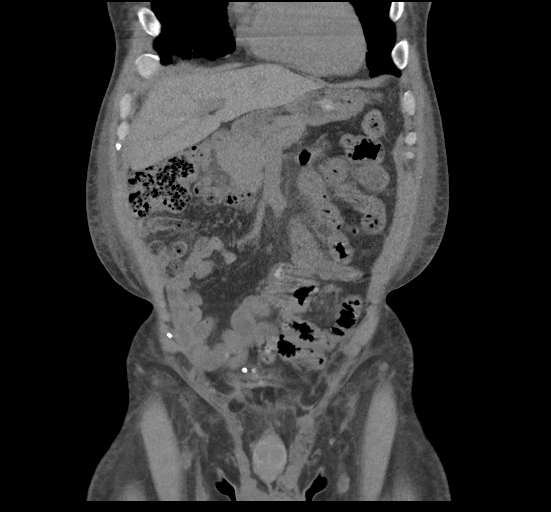
[im 63/141  soft-tissue]
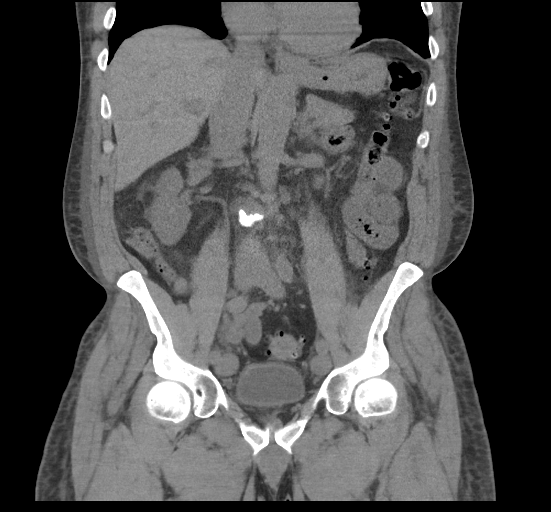
[im 78/141  soft-tissue]
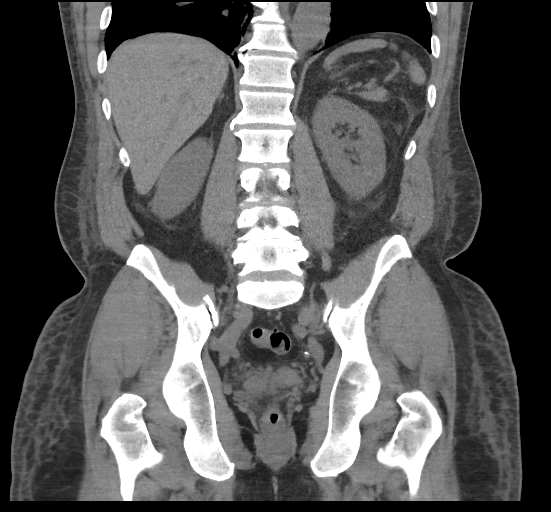

[16 of 46 positions shown; findings below may reference images not displayed]

FINDINGS: Lower chest: Bibasilar scarring and atelectasis. No acute
abnormality.

Hepatobiliary: Calcified granulomas in the right hepatic lobe. No
other focal liver abnormality. The gallbladder is unremarkable.

Pancreas: Unremarkable. No pancreatic ductal dilatation or
surrounding inflammatory changes.

Spleen: Normal in size without focal abnormality.

Adrenals/Urinary Tract: Adrenal glands are unremarkable. No
hydronephrosis or nephrolithiasis. Bladder is unremarkable.

Stomach/Bowel: The stomach is within normal limits. There is no
evidence of bowel obstruction. Colonic diverticulosis without focal
diverticulitis.The appendix is normal. There is generalized
mesenteric edema, likely third-spacing.

Vascular/Lymphatic: Scattered atherosclerosis.  No lymphadenopathy.

Reproductive: Unremarkable.

Other: Trace abdominopelvic free fluid. No free air. Prior inguinal
hernia repair.

Musculoskeletal: No acute osseous abnormality. No suspicious lytic
or blastic lesions. Multilevel degenerative change of the spine,
severe at L4-L5 multilevel facet arthropathy. Body wall edema.
IMPRESSION: Generalized mesenteric edema, trace abdominopelvic ascites, and body
wall edema likely related to third spacing.

No other acute findings in the abdomen or pelvis. Specifically, no
hydronephrosis or nephroureterolithiasis.

## 2023-12-18 DIAGNOSIS — I129 Hypertensive chronic kidney disease with stage 1 through stage 4 chronic kidney disease, or unspecified chronic kidney disease: Secondary | ICD-10-CM | POA: Diagnosis not present

## 2023-12-18 DIAGNOSIS — N182 Chronic kidney disease, stage 2 (mild): Secondary | ICD-10-CM | POA: Diagnosis not present

## 2023-12-18 DIAGNOSIS — N269 Renal sclerosis, unspecified: Secondary | ICD-10-CM | POA: Diagnosis not present

## 2023-12-18 DIAGNOSIS — N053 Unspecified nephritic syndrome with diffuse mesangial proliferative glomerulonephritis: Secondary | ICD-10-CM | POA: Diagnosis not present

## 2023-12-25 IMAGING — US US RENAL
1 series · 14 of 25 positions shown · non-contrast
Comparison: None Available.

CLINICAL DATA: Acute kidney failure.

EXAM:
RENAL / URINARY TRACT ULTRASOUND COMPLETE

[Series 1: us renal · 14 of 75 slices shown]
[im 1/75]
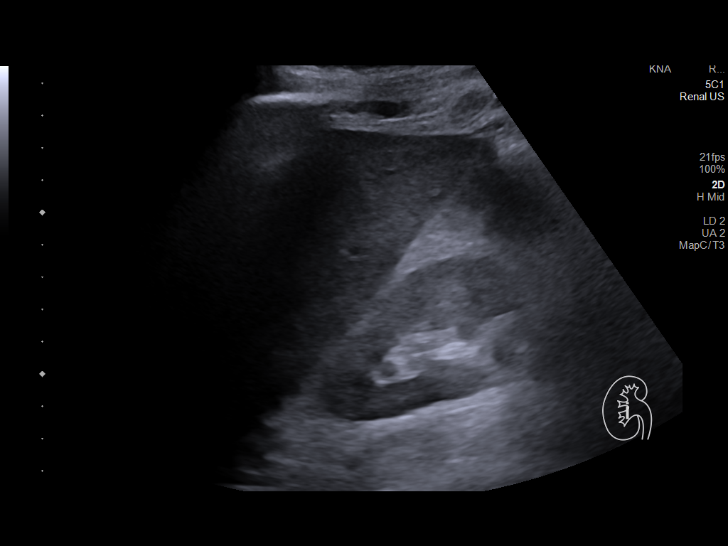
[im 7/75]
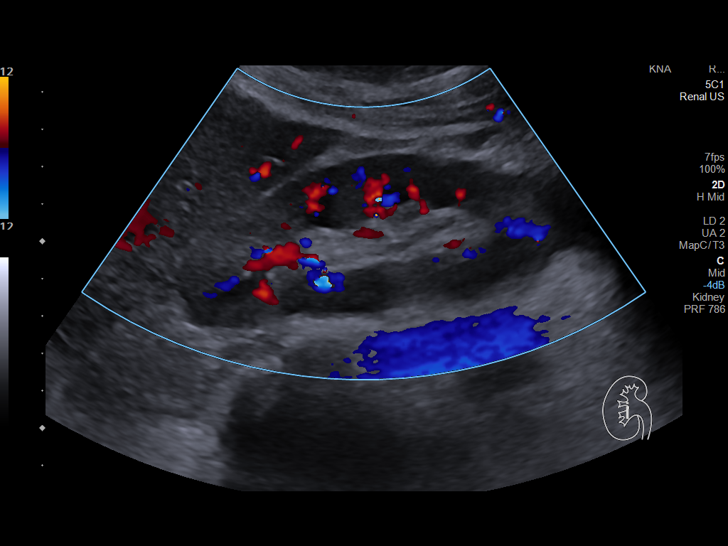
[im 13/75]
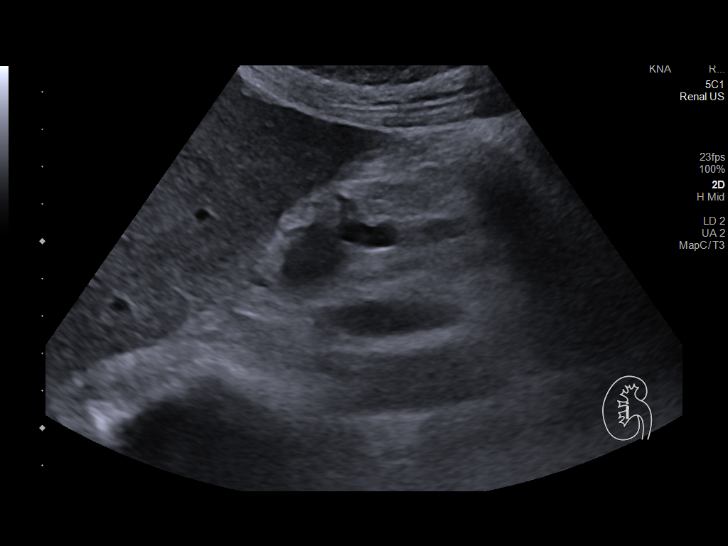
[im 19/75]
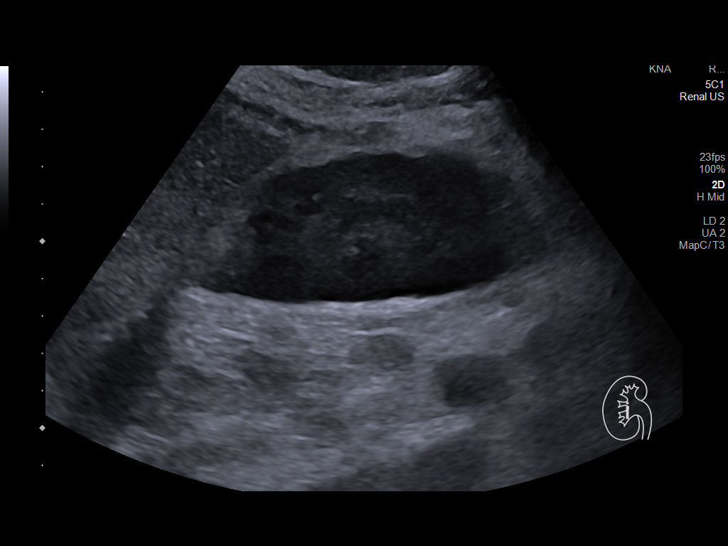
[im 25/75]
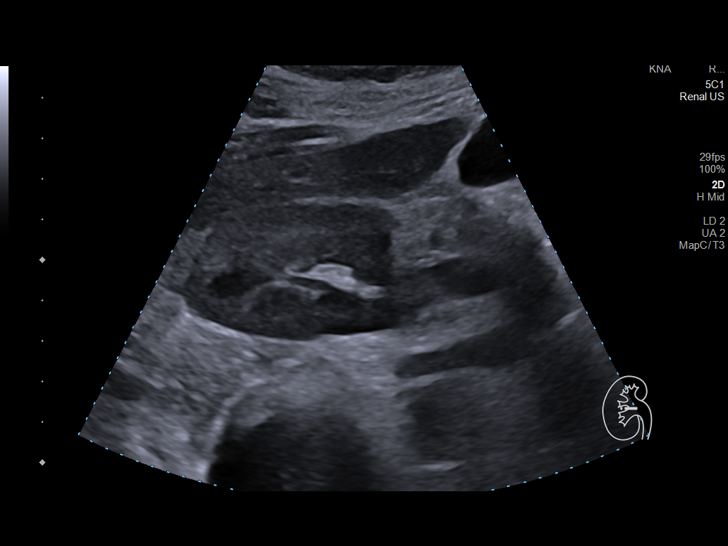
[im 28/75]
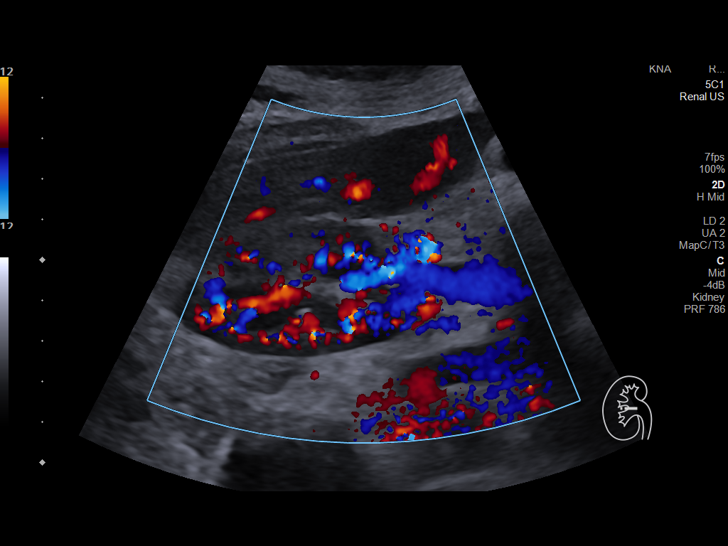
[im 34/75]
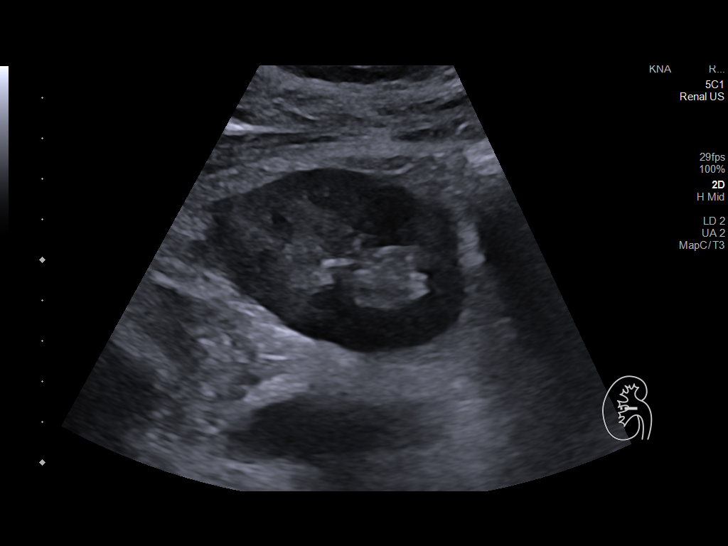
[im 41/75]
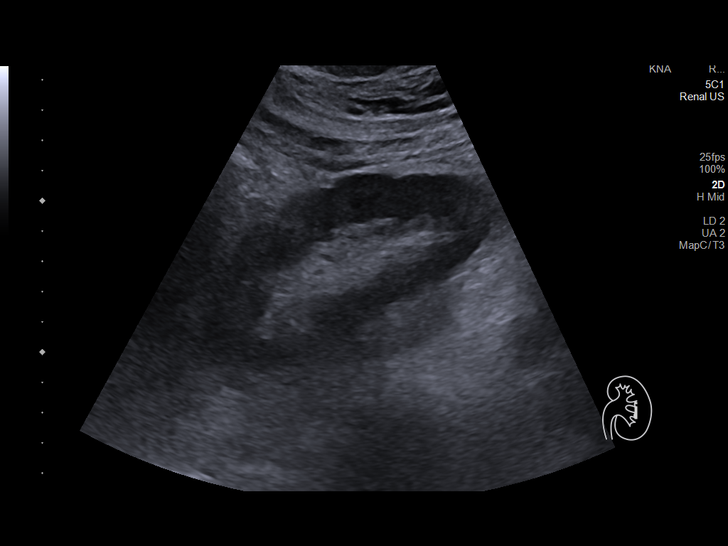
[im 47/75]
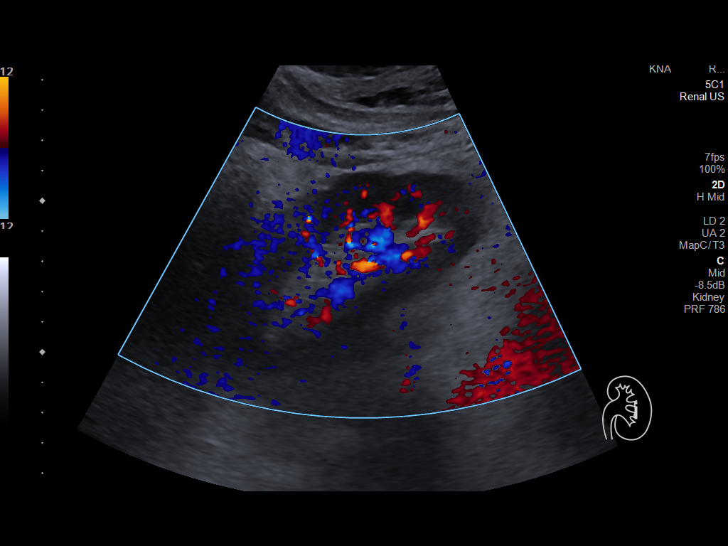
[im 50/75]
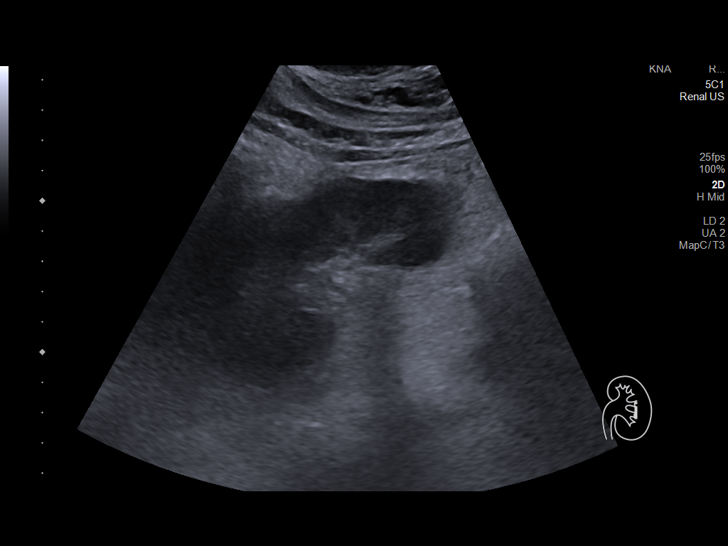
[im 56/75]
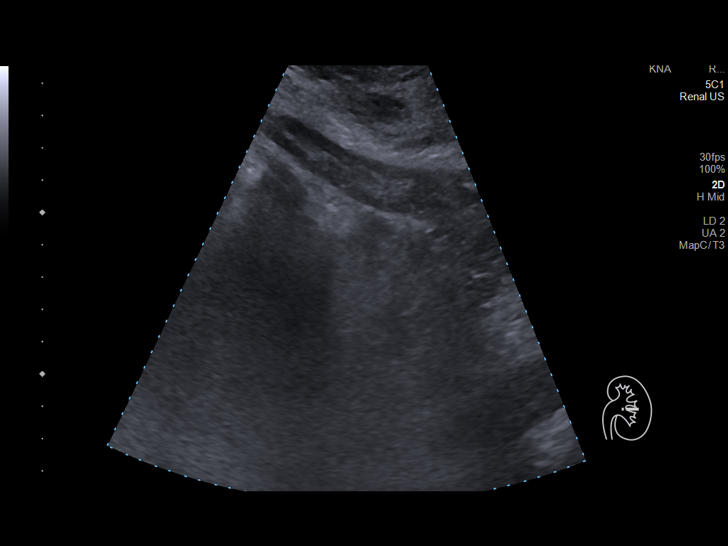
[im 62/75]
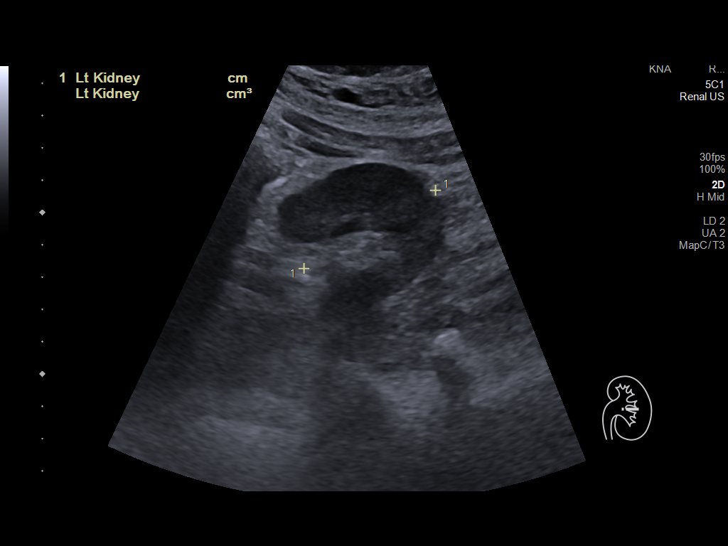
[im 68/75]
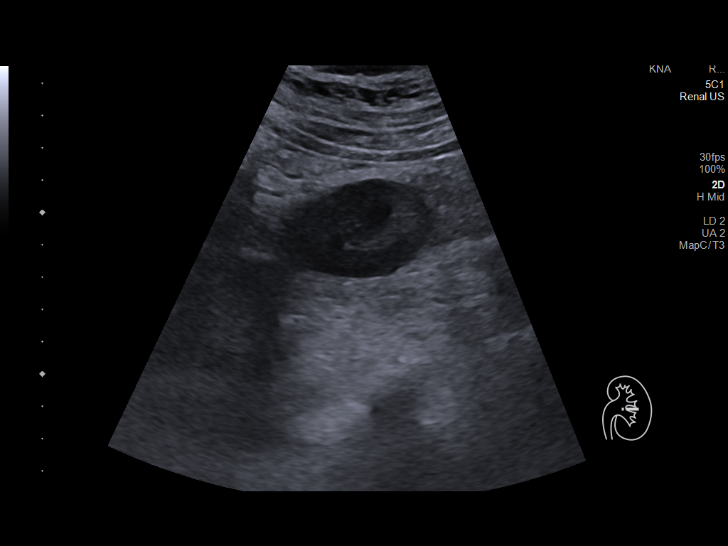
[im 75/75]
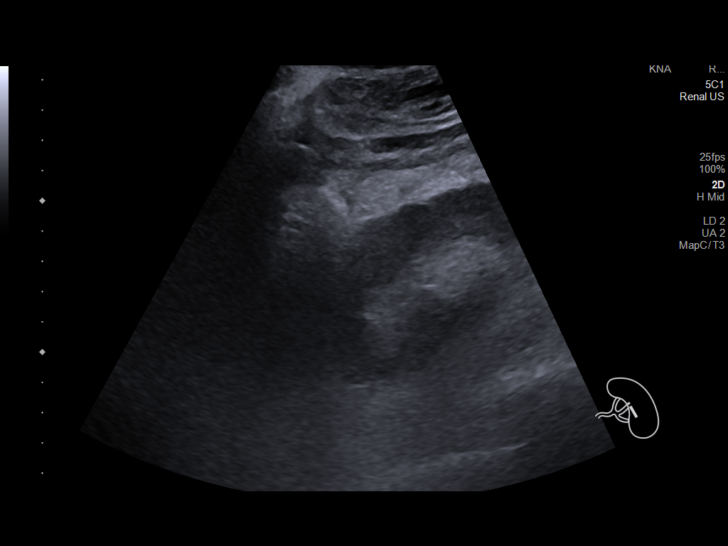

[14 of 25 positions shown; findings below may reference images not displayed]

FINDINGS: Right Kidney:

Renal measurements: 11.2 x 5.3 x 4.4 cm = volume: 137 mL.
Echogenicity within normal limits. No mass or hydronephrosis
visualized.

Left Kidney:

Renal measurements: 10.8 x 5.1 x 4.7 cm = volume: 137 mL.
Echogenicity within normal limits. No mass or hydronephrosis
visualized.

Bladder:

Appears normal for degree of bladder distention.

Other:

None.
IMPRESSION: No hydronephrosis.

## 2024-01-19 DIAGNOSIS — L570 Actinic keratosis: Secondary | ICD-10-CM | POA: Diagnosis not present

## 2024-02-15 DIAGNOSIS — H40013 Open angle with borderline findings, low risk, bilateral: Secondary | ICD-10-CM | POA: Diagnosis not present

## 2024-02-15 DIAGNOSIS — H2513 Age-related nuclear cataract, bilateral: Secondary | ICD-10-CM | POA: Diagnosis not present

## 2024-05-02 ENCOUNTER — Other Ambulatory Visit (HOSPITAL_COMMUNITY)
Admission: RE | Admit: 2024-05-02 | Discharge: 2024-05-02 | Disposition: A | Discharge status: Home / Self Care | Source: Ambulatory Visit | Attending: Nephrology | Admitting: Nephrology

## 2024-05-02 DIAGNOSIS — N189 Chronic kidney disease, unspecified: Secondary | ICD-10-CM | POA: Insufficient documentation

## 2024-05-02 DIAGNOSIS — R809 Proteinuria, unspecified: Secondary | ICD-10-CM | POA: Insufficient documentation

## 2024-05-02 DIAGNOSIS — I10 Essential (primary) hypertension: Secondary | ICD-10-CM | POA: Insufficient documentation

## 2024-05-02 DIAGNOSIS — E119 Type 2 diabetes mellitus without complications: Secondary | ICD-10-CM | POA: Insufficient documentation

## 2024-05-02 DIAGNOSIS — E559 Vitamin D deficiency, unspecified: Secondary | ICD-10-CM | POA: Diagnosis not present

## 2024-05-02 DIAGNOSIS — D631 Anemia in chronic kidney disease: Secondary | ICD-10-CM | POA: Diagnosis not present

## 2024-05-02 LAB — CBC
HCT: 46.7 % (ref 39.0–52.0)
Hemoglobin: 15.5 g/dL (ref 13.0–17.0)
MCH: 31 pg (ref 26.0–34.0)
MCHC: 33.2 g/dL (ref 30.0–36.0)
MCV: 93.4 fL (ref 80.0–100.0)
Platelets: 287 K/uL (ref 150–400)
RBC: 5 MIL/uL (ref 4.22–5.81)
RDW: 13.1 % (ref 11.5–15.5)
WBC: 6 K/uL (ref 4.0–10.5)
nRBC: 0 % (ref 0.0–0.2)

## 2024-05-02 LAB — RENAL FUNCTION PANEL
Albumin: 3.9 g/dL (ref 3.5–5.0)
Anion gap: 6 (ref 5–15)
BUN: 20 mg/dL (ref 8–23)
CO2: 30 mmol/L (ref 22–32)
Calcium: 9.3 mg/dL (ref 8.9–10.3)
Chloride: 106 mmol/L (ref 98–111)
Creatinine, Ser: 1.2 mg/dL (ref 0.61–1.24)
GFR, Estimated: >60 mL/min (ref >60)
Glucose, Bld: 95 mg/dL (ref 70–99)
Phosphorus: 2.7 mg/dL (ref 2.5–4.6)
Potassium: 4.4 mmol/L (ref 3.5–5.1)
Sodium: 141 mmol/L (ref 135–145)

## 2024-05-02 LAB — URINALYSIS, W/ REFLEX TO CULTURE (INFECTION SUSPECTED)
Bacteria, UA: NONE SEEN
Bilirubin Urine: NEGATIVE
Glucose, UA: >=500 mg/dL — AB
Hgb urine dipstick: NEGATIVE
Ketones, ur: NEGATIVE mg/dL
Leukocytes,Ua: NEGATIVE
Nitrite: NEGATIVE
Protein, ur: >=300 mg/dL — AB
Specific Gravity, Urine: 1.027 (ref 1.005–1.030)
pH: 6 (ref 5.0–8.0)

## 2024-05-03 LAB — MICROALBUMIN / CREATININE URINE RATIO
Creatinine, Urine: 118.7 mg/dL
Microalb Creat Ratio: 1812 mg/g{creat} — ABNORMAL HIGH (ref 0–29)
Microalb, Ur: 2150.8 ug/mL — ABNORMAL HIGH

## 2024-05-04 DIAGNOSIS — N269 Renal sclerosis, unspecified: Secondary | ICD-10-CM | POA: Diagnosis not present

## 2024-05-04 DIAGNOSIS — N041 Nephrotic syndrome with focal and segmental glomerular lesions: Secondary | ICD-10-CM | POA: Diagnosis not present

## 2024-05-04 DIAGNOSIS — N182 Chronic kidney disease, stage 2 (mild): Secondary | ICD-10-CM | POA: Diagnosis not present

## 2024-05-04 DIAGNOSIS — N053 Unspecified nephritic syndrome with diffuse mesangial proliferative glomerulonephritis: Secondary | ICD-10-CM | POA: Diagnosis not present

## 2024-05-04 LAB — MISC LABCORP TEST (SEND OUT): Labcorp test code: 81950

## 2024-05-05 DIAGNOSIS — I10 Essential (primary) hypertension: Secondary | ICD-10-CM | POA: Diagnosis not present

## 2024-05-05 DIAGNOSIS — E559 Vitamin D deficiency, unspecified: Secondary | ICD-10-CM | POA: Diagnosis not present

## 2024-05-05 DIAGNOSIS — R809 Proteinuria, unspecified: Secondary | ICD-10-CM | POA: Diagnosis not present

## 2024-05-11 DIAGNOSIS — R809 Proteinuria, unspecified: Secondary | ICD-10-CM | POA: Diagnosis not present

## 2024-05-11 DIAGNOSIS — R978 Other abnormal tumor markers: Secondary | ICD-10-CM | POA: Diagnosis not present

## 2024-05-11 DIAGNOSIS — M79604 Pain in right leg: Secondary | ICD-10-CM | POA: Diagnosis not present

## 2024-05-11 DIAGNOSIS — Z23 Encounter for immunization: Secondary | ICD-10-CM | POA: Diagnosis not present

## 2024-05-11 DIAGNOSIS — K219 Gastro-esophageal reflux disease without esophagitis: Secondary | ICD-10-CM | POA: Diagnosis not present

## 2024-05-11 DIAGNOSIS — R053 Chronic cough: Secondary | ICD-10-CM | POA: Diagnosis not present

## 2024-05-11 DIAGNOSIS — I10 Essential (primary) hypertension: Secondary | ICD-10-CM | POA: Diagnosis not present

## 2024-05-11 DIAGNOSIS — I129 Hypertensive chronic kidney disease with stage 1 through stage 4 chronic kidney disease, or unspecified chronic kidney disease: Secondary | ICD-10-CM | POA: Diagnosis not present

## 2024-05-11 DIAGNOSIS — E875 Hyperkalemia: Secondary | ICD-10-CM | POA: Diagnosis not present

## 2024-05-11 DIAGNOSIS — N1832 Chronic kidney disease, stage 3b: Secondary | ICD-10-CM | POA: Diagnosis not present

## 2024-05-11 DIAGNOSIS — E782 Mixed hyperlipidemia: Secondary | ICD-10-CM | POA: Diagnosis not present

## 2024-05-11 DIAGNOSIS — G4733 Obstructive sleep apnea (adult) (pediatric): Secondary | ICD-10-CM | POA: Diagnosis not present
